# Patient Record
Sex: Female | Born: 1945 | Race: Black or African American | Hispanic: No | Marital: Single | Smoking: Never smoker
Health system: Southern US, Community
[De-identification: ages and names within clinical notes are randomized; demographics above are authoritative.]

## PROBLEM LIST (undated history)

## (undated) DIAGNOSIS — K219 Gastro-esophageal reflux disease without esophagitis: Secondary | ICD-10-CM

## (undated) DIAGNOSIS — E119 Type 2 diabetes mellitus without complications: Secondary | ICD-10-CM

## (undated) DIAGNOSIS — N189 Chronic kidney disease, unspecified: Secondary | ICD-10-CM

## (undated) DIAGNOSIS — I10 Essential (primary) hypertension: Secondary | ICD-10-CM

## (undated) DIAGNOSIS — N814 Uterovaginal prolapse, unspecified: Secondary | ICD-10-CM

## (undated) HISTORY — DX: Uterovaginal prolapse, unspecified: N81.4

---

## 2005-07-10 ENCOUNTER — Emergency Department: Payer: Self-pay | Admitting: Emergency Medicine

## 2005-07-10 ENCOUNTER — Other Ambulatory Visit: Payer: Self-pay

## 2005-09-07 ENCOUNTER — Emergency Department: Payer: Self-pay | Admitting: Emergency Medicine

## 2005-09-07 ENCOUNTER — Other Ambulatory Visit: Payer: Self-pay

## 2005-11-29 ENCOUNTER — Ambulatory Visit: Payer: Self-pay | Admitting: Ophthalmology

## 2006-02-17 ENCOUNTER — Other Ambulatory Visit: Payer: Self-pay

## 2006-02-17 ENCOUNTER — Emergency Department: Payer: Self-pay | Admitting: Emergency Medicine

## 2009-11-25 ENCOUNTER — Emergency Department: Payer: Self-pay | Admitting: Emergency Medicine

## 2010-03-13 ENCOUNTER — Emergency Department: Payer: Self-pay | Admitting: Emergency Medicine

## 2010-12-03 ENCOUNTER — Emergency Department: Payer: Self-pay | Admitting: Emergency Medicine

## 2011-05-21 ENCOUNTER — Emergency Department: Payer: Self-pay | Admitting: Emergency Medicine

## 2013-11-12 ENCOUNTER — Ambulatory Visit: Payer: Self-pay | Admitting: Ophthalmology

## 2013-11-12 LAB — POTASSIUM: Potassium: 3.1 mmol/L — ABNORMAL LOW (ref 3.5–5.1)

## 2013-11-25 ENCOUNTER — Ambulatory Visit: Payer: Self-pay | Admitting: Ophthalmology

## 2014-05-19 DIAGNOSIS — I10 Essential (primary) hypertension: Secondary | ICD-10-CM | POA: Diagnosis present

## 2014-05-19 DIAGNOSIS — E1122 Type 2 diabetes mellitus with diabetic chronic kidney disease: Secondary | ICD-10-CM | POA: Diagnosis present

## 2014-05-19 DIAGNOSIS — E669 Obesity, unspecified: Secondary | ICD-10-CM | POA: Diagnosis present

## 2014-12-27 NOTE — Op Note (Signed)
PATIENT NAME:  Tracy Webster, Tracy Webster MR#:  [id number] DATE OF BIRTH:  11/14/1945  DATE OF PROCEDURE:  11/25/2013  PREOPERATIVE DIAGNOSIS: Cataract, left eye.   POSTOPERATIVE DIAGNOSIS: Cataract, left eye.   PROCEDURE PERFORMED: Extracapsular cataract extraction using phacoemulsification with placement Alcon [id number] 22.0-diopter posterior chamber lens, serial number [id number].  SURGEON: Maylon PeppersSteven A. , M.D.   ANESTHESIA: 4% lidocaine and 0.75% Marcaine in a 50-50 mixture with 10 units/mL of Hylenex added, given as a peribulbar.   ANESTHESIOLOGIST: Dr. Darleene CleaverVan Staveren  COMPLICATIONS: None.   ESTIMATED BLOOD LOSS: Less than 1 mL.   DESCRIPTION OF PROCEDURE: The patient was brought to the operating room and given IV sedation and a peribulbar block. She was then prepped and draped in the usual fashion. Vertical rectus muscles were imbricated using 5-0 silk sutures, bridle sutures. Limbal peritomy was carried out. Hemostasis was obtained at the limbus using cautery and a partial-thickness scleral groove was made in the posterior surgical limbus. This was dissected anteriorly through clear cornea with an Alcon crescent knife. The anterior chamber was entered superonasally and superotemporally through clear cornea with a paracentesis knife. Air was injected through the paracentesis and VisionBlue was used to paint the anterior capsule. The air was replaced with balanced salt and the anterior chamber was entered through the lamellar dissection with a 2.6 mm keratome. DisCoVisc was used to replace the aqueous and a continuous tear circular capsulorrhexis was carried out. Hydrodelineation was used to loosen the nucleus and phacoemulsification was carried out in divide and conquer technique. Ultrasound time was 1 minute and 13.7 seconds with an average power of 21.8% and a CDE of 26.64. Irrigation-aspiration was used to remove the residual cortex. The capsular bag was inflated with DisCoVisc and an  intraocular lens was inserted in the capsular bag under direct visualization. Irrigation-aspiration was used to remove the residual DisCoVisc. The wound was inflated with balanced salt and Miostat was injected through the paracentesis track to induce miosis. The wound was checked for leaks. None were found. The conjunctiva was closed with cautery. The bridle sutures were removed. Two drops of Vigamox were placed on the surface of the eye and a shield was placed over the eye, and the patient was discharged to the recovery room in good condition.   ____________________________ Maylon PeppersSteven A. , MD sad:sb D: 11/25/2013 13:46:55 ET T: 11/25/2013 15:18:48 ET JOB#: [id number]  cc: Viviann SpareSteven A. , MD, <Dictator> Erline LevineSTEVEN A  MD ELECTRONICALLY SIGNED 12/02/2013 13:27

## 2015-09-17 ENCOUNTER — Other Ambulatory Visit: Payer: Self-pay | Admitting: Family Medicine

## 2015-09-17 DIAGNOSIS — Z1231 Encounter for screening mammogram for malignant neoplasm of breast: Secondary | ICD-10-CM

## 2015-10-14 ENCOUNTER — Emergency Department
Admission: EM | Admit: 2015-10-14 | Discharge: 2015-10-14 | Disposition: A | Discharge status: Home / Self Care | Payer: Medicare HMO | Attending: Emergency Medicine | Admitting: Emergency Medicine

## 2015-10-14 DIAGNOSIS — R1084 Generalized abdominal pain: Secondary | ICD-10-CM | POA: Diagnosis present

## 2015-10-14 DIAGNOSIS — K529 Noninfective gastroenteritis and colitis, unspecified: Secondary | ICD-10-CM | POA: Diagnosis not present

## 2015-10-14 HISTORY — DX: Type 2 diabetes mellitus without complications: E11.9

## 2015-10-14 HISTORY — DX: Essential (primary) hypertension: I10

## 2015-10-14 LAB — URINALYSIS COMPLETE WITH MICROSCOPIC (ARMC ONLY)
Bacteria, UA: NONE SEEN
Bilirubin Urine: NEGATIVE
Glucose, UA: NEGATIVE mg/dL
Ketones, ur: NEGATIVE mg/dL
Leukocytes, UA: NEGATIVE
Nitrite: NEGATIVE
Protein, ur: NEGATIVE mg/dL
Specific Gravity, Urine: 1.004 — ABNORMAL LOW (ref 1.005–1.030)
pH: 5 (ref 5.0–8.0)

## 2015-10-14 LAB — COMPREHENSIVE METABOLIC PANEL
ALBUMIN: 3.6 g/dL (ref 3.5–5.0)
ALK PHOS: 69 U/L (ref 38–126)
ALT: 25 U/L (ref 14–54)
ANION GAP: 9 (ref 5–15)
AST: 29 U/L (ref 15–41)
BILIRUBIN TOTAL: 0.6 mg/dL (ref 0.3–1.2)
BUN: 36 mg/dL — AB (ref 6–20)
CALCIUM: 7.8 mg/dL — AB (ref 8.9–10.3)
CO2: 22 mmol/L (ref 22–32)
CREATININE: 1.62 mg/dL — AB (ref 0.44–1.00)
Chloride: 103 mmol/L (ref 101–111)
GFR calc Af Amer: 36 mL/min — ABNORMAL LOW (ref >60)
GFR calc non Af Amer: 31 mL/min — ABNORMAL LOW (ref >60)
GLUCOSE: 134 mg/dL — AB (ref 65–99)
Potassium: 3.3 mmol/L — ABNORMAL LOW (ref 3.5–5.1)
Sodium: 134 mmol/L — ABNORMAL LOW (ref 135–145)
TOTAL PROTEIN: 7 g/dL (ref 6.5–8.1)

## 2015-10-14 LAB — CBC WITH DIFFERENTIAL/PLATELET
BASOS PCT: 0 %
Basophils Absolute: 0 10*3/uL (ref 0–0.1)
Eosinophils Absolute: 0.1 10*3/uL (ref 0–0.7)
Eosinophils Relative: 1 %
HEMATOCRIT: 40.9 % (ref 35.0–47.0)
HEMOGLOBIN: 13.7 g/dL (ref 12.0–16.0)
Lymphocytes Relative: 14 %
Lymphs Abs: 1 10*3/uL (ref 1.0–3.6)
MCH: 29.8 pg (ref 26.0–34.0)
MCHC: 33.4 g/dL (ref 32.0–36.0)
MCV: 89.2 fL (ref 80.0–100.0)
MONOS PCT: 7 %
Monocytes Absolute: 0.5 10*3/uL (ref 0.2–0.9)
NEUTROS ABS: 5.7 10*3/uL (ref 1.4–6.5)
NEUTROS PCT: 78 %
Platelets: 305 10*3/uL (ref 150–440)
RBC: 4.58 MIL/uL (ref 3.80–5.20)
RDW: 13.8 % (ref 11.5–14.5)
WBC: 7.4 10*3/uL (ref 3.6–11.0)

## 2015-10-14 LAB — LIPASE, BLOOD: Lipase: 20 U/L (ref 11–51)

## 2015-10-14 MED ORDER — LOPERAMIDE HCL 2 MG PO CAPS
4.0000 mg | ORAL_CAPSULE | Freq: Once | ORAL | Status: AC
  Administered 2015-10-14: 4 mg via ORAL
  Filled 2015-10-14: qty 2

## 2015-10-14 MED ORDER — FAMOTIDINE 20 MG PO TABS
20.0000 mg | ORAL_TABLET | Freq: Once | ORAL | Status: AC
  Administered 2015-10-14: 20 mg via ORAL
  Filled 2015-10-14: qty 1

## 2015-10-14 MED ORDER — SODIUM CHLORIDE 0.9 % IV SOLN
1000.0000 mL | Freq: Once | INTRAVENOUS | Status: AC
  Administered 2015-10-14: 1000 mL via INTRAVENOUS

## 2015-10-14 MED ORDER — ONDANSETRON HCL 4 MG/2ML IJ SOLN
4.0000 mg | Freq: Once | INTRAMUSCULAR | Status: AC
  Administered 2015-10-14: 4 mg via INTRAVENOUS
  Filled 2015-10-14: qty 2

## 2015-10-14 MED ORDER — ONDANSETRON HCL 4 MG PO TABS: 4.0000 mg | ORAL_TABLET | Freq: Every day | ORAL | Status: DC | PRN

## 2015-10-14 NOTE — ED Notes (Signed)
Pt discharged home, alert and oriented X 4 active and cooperative. Pt in NAD. Ambulatory.

## 2015-10-14 NOTE — ED Notes (Signed)
Patient complaining of N/V/D since yesterday at lunch time. Patient diabetic and didn't take meds this morning, concerned about blood sugar. CBG 122 per EMS. Patient in NAD at this time. Alert and oriented x4.

## 2015-10-14 NOTE — ED Notes (Signed)
AAOx3.  Skin warm and dry,. NAD 

## 2015-10-14 NOTE — ED Provider Notes (Signed)
[facility] Emergency Department Provider Note     Time seen: ----------------------------------------- 9:13 AM on 10/14/2015 -----------------------------------------    I have reviewed the triage vital signs and the nursing notes.   HISTORY  Chief Complaint Abdominal Pain    HPI Tracy Webster is a 70 y.o. female who presents to ER with nausea vomiting and diarrhea. She's not had any nausea vomiting this morning but has had several episodes of diarrhea. Yesterday the symptoms were much worse with more vomiting but she was able to eat something last night. Patient states blood sugar was in the 100s this morning, she is having generalized abdominal pain. Nothing makes it better or worse.   No past medical history on file.  There are no active problems to display for this patient.   No past surgical history on file.  Allergies Review of patient's allergies indicates not on file.  Social History Social History  Substance Use Topics  . Smoking status: Not on file  . Smokeless tobacco: Not on file  . Alcohol Use: Not on file    Review of Systems Constitutional: Negative for fever. Eyes: Negative for visual changes. ENT: Negative for sore throat. Cardiovascular: Negative for chest pain. Respiratory: Negative for shortness of breath. Gastrointestinal: Positive for abdominal pain, vomiting and diarrhea Genitourinary: Negative for dysuria. Musculoskeletal: Negative for back pain. Skin: Negative for rash. Neurological: Negative for headaches, focal weakness or numbness.  10-point ROS otherwise negative.  ____________________________________________   PHYSICAL EXAM:  VITAL SIGNS: ED Triage Vitals  Enc Vitals Group     BP --      Pulse --      Resp --      Temp --      Temp src --      SpO2 --      Weight --      Height --      Head Cir --      Peak Flow --      Pain Score --      Pain Loc --      Pain Edu? --      Excl. in  GC? --     Constitutional: Alert and oriented. Well appearing and in no distress. Eyes: Conjunctivae are normal. PERRL. Normal extraocular movements. ENT   Head: Normocephalic and atraumatic.   Nose: No congestion/rhinnorhea.   Mouth/Throat: Mucous membranes are moist.   Neck: No stridor. Cardiovascular: Normal rate, regular rhythm. Normal and symmetric distal pulses are present in all extremities. No murmurs, rubs, or gallops. Respiratory: Normal respiratory effort without tachypnea nor retractions. Breath sounds are clear and equal bilaterally. No wheezes/rales/rhonchi. Gastrointestinal: Soft and nontender. No distention. No abdominal bruits.  Musculoskeletal: Nontender with normal range of motion in all extremities. No joint effusions.  No lower extremity tenderness nor edema. Neurologic:  Normal speech and language. No gross focal neurologic deficits are appreciated.  Skin:  Skin is warm, dry and intact. No rash noted. Psychiatric: Mood and affect are normal. Speech and behavior are normal. Patient exhibits appropriate insight and judgment. ____________________________________________  ED COURSE:  Pertinent labs & imaging results that were available during my care of the patient were reviewed by me and considered in my medical decision making (see chart for details). Patient's no acute distress, likely Norovirus infection. She'll receive IV fluids and antiemetics. ____________________________________________    LABS (pertinent positives/negatives)  Labs Reviewed  COMPREHENSIVE METABOLIC PANEL - Abnormal; Notable for the following:    Sodium 134 (*)  Potassium 3.3 (*)    Glucose, Bld 134 (*)    BUN 36 (*)    Creatinine, Ser 1.62 (*)    Calcium 7.8 (*)    GFR calc non Af Amer 31 (*)    GFR calc Af Amer 36 (*)    All other components within normal limits  CBC WITH DIFFERENTIAL/PLATELET  LIPASE, BLOOD  URINALYSIS COMPLETEWITH MICROSCOPIC ([facility] ONLY)   ____________________________________________  FINAL ASSESSMENT AND PLAN  Gastroenteritis  Plan: Patient with labs as dictated above. Patient is feeling better, she is in no acute distress and will be discharged with antiemetics and encouraged to have follow-up with her doctor in 2 days for recheck.   Emily Filbert, MD   Emily Filbert, MD 10/14/15 1106

## 2015-10-14 NOTE — ED Notes (Signed)
Taking clear liquids well.  No N/V/D

## 2015-10-14 NOTE — Discharge Instructions (Signed)
Norovirus Infection °A norovirus infection is caused by exposure to a virus in a group of similar viruses (noroviruses). This type of infection causes inflammation in your stomach and intestines (gastroenteritis). Norovirus is the most common cause of gastroenteritis. It also causes food poisoning. °Anyone can get a norovirus infection. It spreads very easily (contagious). You can get it from contaminated food, water, surfaces, or other people. Norovirus is found in the stool or vomit of infected people. You can spread the infection as soon as you feel sick until 2 weeks after you recover.  °Symptoms usually begin within 2 days after you become infected. Most norovirus symptoms affect the digestive system. °CAUSES °Norovirus infection is caused by contact with norovirus. You can catch norovirus if you: °· Eat or drink something contaminated with norovirus. °· Touch surfaces or objects contaminated with norovirus and then put your hand in your mouth. °· Have direct contact with an infected person who has symptoms. °· Share food, drink, or utensils with someone with who is sick with norovirus. °SIGNS AND SYMPTOMS °Symptoms of norovirus may include: °· Nausea. °· Vomiting. °· Diarrhea. °· Stomach cramps. °· Fever. °· Chills. °· Headache. °· Muscle aches. °· Tiredness. °DIAGNOSIS °Your health care provider may suspect norovirus based on your symptoms and physical exam. Your health care provider may also test a sample of your stool or vomit for the virus.  °TREATMENT °There is no specific treatment for norovirus. Most people get better without treatment in about 2 days. °HOME CARE INSTRUCTIONS °· Replace lost fluids by drinking plenty of water or rehydration fluids containing important minerals called electrolytes. This prevents dehydration. Drink enough fluid to keep your urine clear or pale yellow. °· Do not prepare food for others while you are infected. Wait at least 3 days after recovering from the illness to do  that. °PREVENTION  °· Wash your hands often, especially after using the toilet or changing a diaper. °· Wash fruits and vegetables thoroughly before preparing or serving them. °· Throw out any food that a sick person may have touched. °· Disinfect contaminated surfaces immediately after someone in the household has been sick. Use a bleach-based household cleaner. °· Immediately remove and wash soiled clothes or sheets. °SEEK MEDICAL CARE IF: °· Your vomiting, diarrhea, and stomach pain is getting worse. °· Your symptoms of norovirus do not go away after 2-3 days. °SEEK IMMEDIATE MEDICAL CARE IF:  °You develop symptoms of dehydration that do not improve with fluid replacement. This may include: °· Excessive sleepiness. °· Lack of tears. °· Dry mouth. °· Dizziness when standing. °· Weak pulse. °  °This information is not intended to replace advice given to you by your health care provider. Make sure you discuss any questions you have with your health care provider. °  °Document Released: 11/12/2002 Document Revised: 09/12/2014 Document Reviewed: 01/30/2014 °Elsevier Interactive Patient Education ©2016 [employer] ° °

## 2016-02-15 ENCOUNTER — Emergency Department
Admission: EM | Admit: 2016-02-15 | Discharge: 2016-02-15 | Disposition: A | Discharge status: Home / Self Care | Payer: Medicare HMO | Attending: Emergency Medicine | Admitting: Emergency Medicine

## 2016-02-15 ENCOUNTER — Encounter: Payer: Self-pay | Admitting: Emergency Medicine

## 2016-02-15 DIAGNOSIS — Z79899 Other long term (current) drug therapy: Secondary | ICD-10-CM | POA: Diagnosis not present

## 2016-02-15 DIAGNOSIS — R0789 Other chest pain: Secondary | ICD-10-CM | POA: Insufficient documentation

## 2016-02-15 DIAGNOSIS — E119 Type 2 diabetes mellitus without complications: Secondary | ICD-10-CM | POA: Insufficient documentation

## 2016-02-15 DIAGNOSIS — R1013 Epigastric pain: Secondary | ICD-10-CM | POA: Diagnosis present

## 2016-02-15 DIAGNOSIS — K219 Gastro-esophageal reflux disease without esophagitis: Secondary | ICD-10-CM | POA: Insufficient documentation

## 2016-02-15 DIAGNOSIS — I10 Essential (primary) hypertension: Secondary | ICD-10-CM | POA: Diagnosis not present

## 2016-02-15 LAB — CBC WITH DIFFERENTIAL/PLATELET
Basophils Absolute: 0.1 10*3/uL (ref 0–0.1)
Basophils Relative: 1 %
EOS ABS: 0.1 10*3/uL (ref 0–0.7)
Eosinophils Relative: 2 %
HCT: 41.6 % (ref 35.0–47.0)
HEMOGLOBIN: 13.6 g/dL (ref 12.0–16.0)
LYMPHS ABS: 1.9 10*3/uL (ref 1.0–3.6)
Lymphocytes Relative: 36 %
MCH: 29.3 pg (ref 26.0–34.0)
MCHC: 32.8 g/dL (ref 32.0–36.0)
MCV: 89.4 fL (ref 80.0–100.0)
MONOS PCT: 8 %
Monocytes Absolute: 0.4 10*3/uL (ref 0.2–0.9)
NEUTROS PCT: 53 %
Neutro Abs: 2.8 10*3/uL (ref 1.4–6.5)
Platelets: 271 10*3/uL (ref 150–440)
RBC: 4.65 MIL/uL (ref 3.80–5.20)
RDW: 13.5 % (ref 11.5–14.5)
WBC: 5.2 10*3/uL (ref 3.6–11.0)

## 2016-02-15 LAB — COMPREHENSIVE METABOLIC PANEL
ALBUMIN: 4 g/dL (ref 3.5–5.0)
ALT: 17 U/L (ref 14–54)
AST: 22 U/L (ref 15–41)
Alkaline Phosphatase: 68 U/L (ref 38–126)
Anion gap: 9 (ref 5–15)
BUN: 27 mg/dL — AB (ref 6–20)
CHLORIDE: 108 mmol/L (ref 101–111)
CO2: 24 mmol/L (ref 22–32)
CREATININE: 1.52 mg/dL — AB (ref 0.44–1.00)
Calcium: 9 mg/dL (ref 8.9–10.3)
GFR calc non Af Amer: 34 mL/min — ABNORMAL LOW (ref >60)
GFR, EST AFRICAN AMERICAN: 39 mL/min — AB (ref >60)
Glucose, Bld: 121 mg/dL — ABNORMAL HIGH (ref 65–99)
Potassium: 3.7 mmol/L (ref 3.5–5.1)
SODIUM: 141 mmol/L (ref 135–145)
Total Bilirubin: 0.4 mg/dL (ref 0.3–1.2)
Total Protein: 7.1 g/dL (ref 6.5–8.1)

## 2016-02-15 LAB — LIPASE, BLOOD: LIPASE: 22 U/L (ref 11–51)

## 2016-02-15 LAB — TROPONIN I: Troponin I: <0.03 ng/mL (ref <0.031)

## 2016-02-15 MED ORDER — FAMOTIDINE 20 MG PO TABS
40.0000 mg | ORAL_TABLET | Freq: Once | ORAL | Status: AC
  Administered 2016-02-15: 40 mg via ORAL
  Filled 2016-02-15: qty 2

## 2016-02-15 MED ORDER — RANITIDINE HCL 150 MG PO CAPS: 150.0000 mg | ORAL_CAPSULE | Freq: Two times a day (BID) | ORAL | Status: DC

## 2016-02-15 MED ORDER — SUCRALFATE 1 G PO TABS: 1.0000 g | ORAL_TABLET | Freq: Four times a day (QID) | ORAL | Status: DC

## 2016-02-15 MED ORDER — GI COCKTAIL ~~LOC~~
30.0000 mL | ORAL | Status: AC
  Administered 2016-02-15: 30 mL via ORAL
  Filled 2016-02-15: qty 30

## 2016-02-15 NOTE — ED Provider Notes (Signed)
[facility] Emergency Department Provider Note  ____________________________________________  Time seen: 10:00 AM  I have reviewed the triage vital signs and the nursing notes.   HISTORY  Chief Complaint Gastroesophageal Reflux    HPI Tracy Webster is a 70 y.o. female who complains of epigastric pain radiating up into the middle of her chest. It is otherwise nonradiating. Not associated with short of breath diaphoresis vomiting. It is been constant for the past 2 or 3 days and feels exactly like her GERD. She is convinced that it is GERD. It gets worse after she eats. It is not exertional, not pleuritic. No other aggravating or alleviating factors.     Past Medical History  Diagnosis Date  . Diabetes mellitus without complication (HCC)   . Hypertension      There are no active problems to display for this patient.    History reviewed. No pertinent past surgical history.   Current Outpatient Rx  Name  Route  Sig  Dispense  Refill  . acetaminophen (TYLENOL) 500 MG tablet   Oral   Take 1,000 mg by mouth every 6 (six) hours as needed for mild pain, moderate pain or fever.          . diltiazem (CARDIZEM CD) 300 MG 24 hr capsule   Oral   Take 300 mg by mouth daily.         Marland Kitchen. docusate sodium (COLACE) 100 MG capsule   Oral   Take 100 mg by mouth 2 (two) times daily.         . fexofenadine (ALLEGRA) 180 MG tablet   Oral   Take 180 mg by mouth daily as needed for allergies.          . furosemide (LASIX) 20 MG tablet   Oral   Take 20 mg by mouth every morning.         Marland Kitchen. lisinopril (PRINIVIL,ZESTRIL) 40 MG tablet   Oral   Take 1 tablet by mouth daily.         Marland Kitchen. lovastatin (MEVACOR) 20 MG tablet   Oral   Take 1 tablet by mouth every evening.          . Multiple Vitamins-Minerals (CENTRUM SILVER) tablet   Oral   Take 1 tablet by mouth daily.         . ondansetron (ZOFRAN) 4 MG tablet   Oral   Take 1 tablet (4 mg  total) by mouth daily as needed for nausea or vomiting.   20 tablet   1   . ranitidine (ZANTAC) 150 MG capsule   Oral   Take 1 capsule (150 mg total) by mouth 2 (two) times daily.   28 capsule   0   . sucralfate (CARAFATE) 1 g tablet   Oral   Take 1 tablet (1 g total) by mouth 4 (four) times daily.   120 tablet   1      Allergies Tetracyclines & related   No family history on file.  Social History Social History  Substance Use Topics  . Smoking status: Never Smoker   . Smokeless tobacco: Never Used  . Alcohol Use: No    Review of Systems  Constitutional:   No fever or chills.  Eyes:   No vision changes.  ENT:   No sore throat. No rhinorrhea. Cardiovascular:   Positive as above chest pain. Respiratory:   No dyspnea or cough. Gastrointestinal:   Negative for abdominal pain, vomiting and diarrhea.  Genitourinary:   Negative for dysuria or difficulty urinating. Musculoskeletal:   Negative for focal pain or swelling Neurological:   Negative for headaches 10-point ROS otherwise negative.  ____________________________________________   PHYSICAL EXAM:  VITAL SIGNS: ED Triage Vitals  Enc Vitals Group     BP 02/15/16 0817 173/67 mmHg     Pulse Rate 02/15/16 0817 78     Resp 02/15/16 0817 16     Temp 02/15/16 0817 98 F (36.7 C)     Temp Source 02/15/16 0817 Oral     SpO2 02/15/16 0817 95 %     Weight 02/15/16 0817 180 lb (81.647 kg)     Height 02/15/16 0817 5' (1.524 m)     Head Cir --      Peak Flow --      Pain Score --      Pain Loc --      Pain Edu? --      Excl. in GC? --     Vital signs reviewed, nursing assessments reviewed.   Constitutional:   Alert and oriented. Well appearing and in no distress.Happy and joking. Smiling. Eyes:   No scleral icterus. No conjunctival pallor. PERRL. EOMI.  No nystagmus. ENT   Head:   Normocephalic and atraumatic.   Nose:   No congestion/rhinnorhea. No septal hematoma   Mouth/Throat:   MMM, no  pharyngeal erythema. No peritonsillar mass.    Neck:   No stridor. No SubQ emphysema. No meningismus. No JVD Hematological/Lymphatic/Immunilogical:   No cervical lymphadenopathy. Cardiovascular:   RRR. Symmetric bilateral radial and DP pulses.  No murmurs.  Respiratory:   Normal respiratory effort without tachypnea nor retractions. Breath sounds are clear and equal bilaterally. No wheezes/rales/rhonchi. Gastrointestinal:   Soft and nontender. Non distended. There is no CVA tenderness.  No rebound, rigidity, or guarding. Genitourinary:   deferred Musculoskeletal:   Nontender with normal range of motion in all extremities. No joint effusions.  No lower extremity tenderness.  No edema. Neurologic:   Normal speech and language.  CN 2-10 normal. Motor grossly intact. No gross focal neurologic deficits are appreciated.  Skin:    Skin is warm, dry and intact. No rash noted.  No petechiae, purpura, or bullae.  ____________________________________________    LABS (pertinent positives/negatives) (all labs ordered are listed, but only abnormal results are displayed) Labs Reviewed  COMPREHENSIVE METABOLIC PANEL - Abnormal; Notable for the following:    Glucose, Bld 121 (*)    BUN 27 (*)    Creatinine, Ser 1.52 (*)    GFR calc non Af Amer 34 (*)    GFR calc Af Amer 39 (*)    All other components within normal limits  CBC WITH DIFFERENTIAL/PLATELET  LIPASE, BLOOD  TROPONIN I  CBC WITH DIFFERENTIAL/PLATELET   ____________________________________________   EKG  Interpreted by me Normal sinus rhythm rate of 73, normal axis and intervals. Poor R-wave progression in anterior precordial leads. Normal ST segments and T waves.  ____________________________________________    RADIOLOGY    ____________________________________________   PROCEDURES   ____________________________________________   INITIAL IMPRESSION / ASSESSMENT AND PLAN / ED COURSE  Pertinent labs & imaging  results that were available during my care of the patient were reviewed by me and considered in my medical decision making (see chart for details).  Patient presents with a typical constant pain for the past 2-3 date days, strongly convinced that it is exactly like her usual GERD. Low suspicion for PE ACS or dissection. No evidence of  pneumothorax. Low suspicion for pneumonia or sepsis. Patient was given a GI cocktail and her symptoms resolved. Labs are stable, troponin is negative which should be reliable given the constant symptoms that of an unchanging for the past 2 days at least. We'll discharge him to follow up with primary care and cardiology, continue antacid therapy.     ____________________________________________   FINAL CLINICAL IMPRESSION(S) / ED DIAGNOSES  Final diagnoses:  Gastroesophageal reflux disease, esophagitis presence not specified  Atypical chest pain       Portions of this note were generated with dragon dictation software. Dictation errors may occur despite best attempts at proofreading.   Sharman Cheek, MD 02/15/16 1224

## 2016-02-15 NOTE — Discharge Instructions (Signed)
Gastroesophageal Reflux Disease, Adult °Normally, food travels down the esophagus and stays in the stomach to be digested. However, when a person has gastroesophageal reflux disease (GERD), food and stomach acid move back up into the esophagus. When this happens, the esophagus becomes sore and inflamed. Over time, GERD can create small holes (ulcers) in the lining of the esophagus.  °CAUSES °This condition is caused by a problem with the muscle between the esophagus and the stomach (lower esophageal sphincter, or LES). Normally, the LES muscle closes after food passes through the esophagus to the stomach. When the LES is weakened or abnormal, it does not close properly, and that allows food and stomach acid to go back up into the esophagus. The LES can be weakened by certain dietary substances, medicines, and medical conditions, including: °· Tobacco use. °· Pregnancy. °· Having a hiatal hernia. °· Heavy alcohol use. °· Certain foods and beverages, such as coffee, chocolate, onions, and peppermint. °RISK FACTORS °This condition is more likely to develop in: °· People who have an increased body weight. °· People who have connective tissue disorders. °· People who use NSAID medicines. °SYMPTOMS °Symptoms of this condition include: °· Heartburn. °· Difficult or painful swallowing. °· The feeling of having a lump in the throat. °· A bitter taste in the mouth. °· Bad breath. °· Having a large amount of saliva. °· Having an upset or bloated stomach. °· Belching. °· Chest pain. °· Shortness of breath or wheezing. °· Ongoing (chronic) cough or a night-time cough. °· Wearing away of tooth enamel. °· Weight loss. °Different conditions can cause chest pain. Make sure to see your health care provider if you experience chest pain. °DIAGNOSIS °Your health care provider will take a medical history and perform a physical exam. To determine if you have mild or severe GERD, your health care provider may also monitor how you respond  to treatment. You may also have other tests, including: °· An endoscopy to examine your stomach and esophagus with a small camera. °· A test that measures the acidity level in your esophagus. °· A test that measures how much pressure is on your esophagus. °· A barium swallow or modified barium swallow to show the shape, size, and functioning of your esophagus. °TREATMENT °The goal of treatment is to help relieve your symptoms and to prevent complications. Treatment for this condition may vary depending on how severe your symptoms are. Your health care provider may recommend: °· Changes to your diet. °· Medicine. °· Surgery. °HOME CARE INSTRUCTIONS °Diet °· Follow a diet as recommended by your health care provider. This may involve avoiding foods and drinks such as: °¨ Coffee and tea (with or without caffeine). °¨ Drinks that contain alcohol. °¨ Energy drinks and sports drinks. °¨ Carbonated drinks or sodas. °¨ Chocolate and cocoa. °¨ Peppermint and mint flavorings. °¨ Garlic and onions. °¨ Horseradish. °¨ Spicy and acidic foods, including peppers, chili powder, curry powder, vinegar, hot sauces, and barbecue sauce. °¨ Citrus fruit juices and citrus fruits, such as oranges, lemons, and limes. °¨ Tomato-based foods, such as red sauce, chili, salsa, and pizza with red sauce. °¨ Fried and fatty foods, such as donuts, french fries, potato chips, and high-fat dressings. °¨ High-fat meats, such as hot dogs and fatty cuts of red and white meats, such as rib eye steak, sausage, ham, and bacon. °¨ High-fat dairy items, such as whole milk, butter, and cream cheese. °· Eat small, frequent meals instead of large meals. °· Avoid drinking large amounts of liquid with your   meals. °· Avoid eating meals during the 2-3 hours before bedtime. °· Avoid lying down right after you eat. °· Do not exercise right after you eat. ° General Instructions  °· Pay attention to any changes in your symptoms. °· Take over-the-counter and prescription  medicines only as told by your health care provider. Do not take aspirin, ibuprofen, or other NSAIDs unless your health care provider told you to do so. °· Do not use any tobacco products, including cigarettes, chewing tobacco, and e-cigarettes. If you need help quitting, ask your health care provider. °· Wear loose-fitting clothing. Do not wear anything tight around your waist that causes pressure on your abdomen. °· Raise (elevate) the head of your bed 6 inches (15cm). °· Try to reduce your stress, such as with yoga or meditation. If you need help reducing stress, ask your health care provider. °· If you are overweight, reduce your weight to an amount that is healthy for you. Ask your health care provider for guidance about a safe weight loss goal. °· Keep all follow-up visits as told by your health care provider. This is important. °SEEK MEDICAL CARE IF: °· You have new symptoms. °· You have unexplained weight loss. °· You have difficulty swallowing, or it hurts to swallow. °· You have wheezing or a persistent cough. °· Your symptoms do not improve with treatment. °· You have a hoarse voice. °SEEK IMMEDIATE MEDICAL CARE IF: °· You have pain in your arms, neck, jaw, teeth, or back. °· You feel sweaty, dizzy, or light-headed. °· You have chest pain or shortness of breath. °· You vomit and your vomit looks like blood or coffee grounds. °· You faint. °· Your stool is bloody or black. °· You cannot swallow, drink, or eat. °  °This information is not intended to replace advice given to you by your health care provider. Make sure you discuss any questions you have with your health care provider. °  °Document Released: 06/01/2005 Document Revised: 05/13/2015 Document Reviewed: 12/17/2014 °Elsevier Interactive Patient Education ©2016 [employer] °Nonspecific Chest Pain  °Chest pain can be caused by many different conditions. There is always a chance that your pain could be related to something serious, such as a heart  attack or a blood clot in your lungs. Chest pain can also be caused by conditions that are not life-threatening. If you have chest pain, it is very important to follow up with your health care provider. °CAUSES  °Chest pain can be caused by: °· Heartburn. °· Pneumonia or bronchitis. °· Anxiety or stress. °· Inflammation around your heart (pericarditis) or lung (pleuritis or pleurisy). °· A blood clot in your lung. °· A collapsed lung (pneumothorax). It can develop suddenly on its own (spontaneous pneumothorax) or from trauma to the chest. °· Shingles infection (varicella-zoster virus). °· Heart attack. °· Damage to the bones, muscles, and cartilage that make up your chest wall. This can include: °· Bruised bones due to injury. °· Strained muscles or cartilage due to frequent or repeated coughing or overwork. °· Fracture to one or more ribs. °· Sore cartilage due to inflammation (costochondritis). °RISK FACTORS  °Risk factors for chest pain may include: °· Activities that increase your risk for trauma or injury to your chest. °· Respiratory infections or conditions that cause frequent coughing. °· Medical conditions or overeating that can cause heartburn. °· Heart disease or family history of heart disease. °· Conditions or health behaviors that increase your risk of developing a blood clot. °· Having had chicken pox (  varicella zoster). °SIGNS AND SYMPTOMS °Chest pain can feel like: °· Burning or tingling on the surface of your chest or deep in your chest. °· Crushing, pressure, aching, or squeezing pain. °· Dull or sharp pain that is worse when you move, cough, or take a deep breath. °· Pain that is also felt in your back, neck, shoulder, or arm, or pain that spreads to any of these areas. °Your chest pain may come and go, or it may stay constant. °DIAGNOSIS °Lab tests or other studies may be needed to find the cause of your pain. Your health care provider may have you take a test called an ambulatory ECG  (electrocardiogram). An ECG records your heartbeat patterns at the time the test is performed. You may also have other tests, such as: °· Transthoracic echocardiogram (TTE). During echocardiography, sound waves are used to create a picture of all of the heart structures and to look at how blood flows through your heart. °· Transesophageal echocardiogram (TEE). This is a more advanced imaging test that obtains images from inside your body. It allows your health care provider to see your heart in finer detail. °· Cardiac monitoring. This allows your health care provider to monitor your heart rate and rhythm in real time. °· Holter monitor. This is a portable device that records your heartbeat and can help to diagnose abnormal heartbeats. It allows your health care provider to track your heart activity for several days, if needed. °· Stress tests. These can be done through exercise or by taking medicine that makes your heart beat more quickly. °· Blood tests. °· Imaging tests. °TREATMENT  °Your treatment depends on what is causing your chest pain. Treatment may include: °· Medicines. These may include: °· Acid blockers for heartburn. °· Anti-inflammatory medicine. °· Pain medicine for inflammatory conditions. °· Antibiotic medicine, if an infection is present. °· Medicines to dissolve blood clots. °· Medicines to treat coronary artery disease. °· Supportive care for conditions that do not require medicines. This may include: °· Resting. °· Applying heat or cold packs to injured areas. °· Limiting activities until pain decreases. °HOME CARE INSTRUCTIONS °· If you were prescribed an antibiotic medicine, finish it all even if you start to feel better. °· Avoid any activities that bring on chest pain. °· Do not use any tobacco products, including cigarettes, chewing tobacco, or electronic cigarettes. If you need help quitting, ask your health care provider. °· Do not drink alcohol. °· Take medicines only as directed by  your health care provider. °· Keep all follow-up visits as directed by your health care provider. This is important. This includes any further testing if your chest pain does not go away. °· If heartburn is the cause for your chest pain, you may be told to keep your head raised (elevated) while sleeping. This reduces the chance that acid will go from your stomach into your esophagus. °· Make lifestyle changes as directed by your health care provider. These may include: °¨ Getting regular exercise. Ask your health care provider to suggest some activities that are safe for you. °¨ Eating a heart-healthy diet. A registered dietitian can help you to learn healthy eating options. °¨ Maintaining a healthy weight. °¨ Managing diabetes, if necessary. °¨ Reducing stress. °SEEK MEDICAL CARE IF: °· Your chest pain does not go away after treatment. °· You have a rash with blisters on your chest. °· You have a fever. °SEEK IMMEDIATE MEDICAL CARE IF:  °· Your chest pain is worse. °· You   have an increasing cough, or you cough up blood. °· You have severe abdominal pain. °· You have severe weakness. °· You faint. °· You have chills. °· You have sudden, unexplained chest discomfort. °· You have sudden, unexplained discomfort in your arms, back, neck, or jaw. °· You have shortness of breath at any time. °· You suddenly start to sweat, or your skin gets clammy. °· You feel nauseous or you vomit. °· You suddenly feel light-headed or dizzy. °· Your heart begins to beat quickly, or it feels like it is skipping beats. °These symptoms may represent a serious problem that is an emergency. Do not wait to see if the symptoms will go away. Get medical help right away. Call your local emergency services (911 in the [place] Do not drive yourself to the hospital. °  °This information is not intended to replace advice given to you by your health care provider. Make sure you discuss any questions you have with your health care provider. °  °Document  Released: 06/01/2005 Document Revised: 09/12/2014 Document Reviewed: 03/28/2014 °Elsevier Interactive Patient Education ©2016 [employer] ° °

## 2016-02-15 NOTE — ED Notes (Signed)
States she is having epigastric pain /"bubbling" to chest over the past week  Not sure if it is her reflux or her heart

## 2016-02-15 NOTE — ED Notes (Signed)
States GI cocktail relieved her symptoms.

## 2016-02-15 NOTE — ED Notes (Addendum)
Pt presents with epigastric pain for one week and wants to be checked for acid reflux.  Pt denies any chest pain at this time, NAD. Pt has not had any of her meds this am.

## 2016-12-13 ENCOUNTER — Other Ambulatory Visit: Payer: Self-pay | Admitting: Family Medicine

## 2016-12-13 DIAGNOSIS — Z1239 Encounter for other screening for malignant neoplasm of breast: Secondary | ICD-10-CM

## 2016-12-16 ENCOUNTER — Other Ambulatory Visit: Payer: Self-pay | Admitting: Family Medicine

## 2016-12-16 DIAGNOSIS — Z13820 Encounter for screening for osteoporosis: Secondary | ICD-10-CM

## 2017-08-14 ENCOUNTER — Emergency Department (HOSPITAL_COMMUNITY): Payer: Medicare HMO

## 2017-08-14 ENCOUNTER — Encounter (HOSPITAL_COMMUNITY): Payer: Self-pay | Admitting: Emergency Medicine

## 2017-08-14 ENCOUNTER — Inpatient Hospital Stay (HOSPITAL_COMMUNITY)
Admission: EM | Admit: 2017-08-14 | Discharge: 2017-08-17 | DRG: 291 | Disposition: A | Discharge status: Home / Self Care | Payer: Medicare HMO | Attending: Family Medicine | Admitting: Family Medicine

## 2017-08-14 ENCOUNTER — Other Ambulatory Visit: Payer: Self-pay

## 2017-08-14 DIAGNOSIS — R0902 Hypoxemia: Secondary | ICD-10-CM

## 2017-08-14 DIAGNOSIS — I5031 Acute diastolic (congestive) heart failure: Secondary | ICD-10-CM | POA: Diagnosis present

## 2017-08-14 DIAGNOSIS — G4733 Obstructive sleep apnea (adult) (pediatric): Secondary | ICD-10-CM | POA: Diagnosis present

## 2017-08-14 DIAGNOSIS — E785 Hyperlipidemia, unspecified: Secondary | ICD-10-CM | POA: Diagnosis present

## 2017-08-14 DIAGNOSIS — K219 Gastro-esophageal reflux disease without esophagitis: Secondary | ICD-10-CM | POA: Diagnosis present

## 2017-08-14 DIAGNOSIS — J9801 Acute bronchospasm: Secondary | ICD-10-CM

## 2017-08-14 DIAGNOSIS — R0602 Shortness of breath: Secondary | ICD-10-CM | POA: Diagnosis present

## 2017-08-14 DIAGNOSIS — I130 Hypertensive heart and chronic kidney disease with heart failure and stage 1 through stage 4 chronic kidney disease, or unspecified chronic kidney disease: Principal | ICD-10-CM | POA: Diagnosis present

## 2017-08-14 DIAGNOSIS — T383X5A Adverse effect of insulin and oral hypoglycemic [antidiabetic] drugs, initial encounter: Secondary | ICD-10-CM | POA: Diagnosis present

## 2017-08-14 DIAGNOSIS — J441 Chronic obstructive pulmonary disease with (acute) exacerbation: Secondary | ICD-10-CM | POA: Diagnosis not present

## 2017-08-14 DIAGNOSIS — E160 Drug-induced hypoglycemia without coma: Secondary | ICD-10-CM

## 2017-08-14 DIAGNOSIS — E669 Obesity, unspecified: Secondary | ICD-10-CM | POA: Diagnosis present

## 2017-08-14 DIAGNOSIS — D631 Anemia in chronic kidney disease: Secondary | ICD-10-CM | POA: Diagnosis present

## 2017-08-14 DIAGNOSIS — Z794 Long term (current) use of insulin: Secondary | ICD-10-CM

## 2017-08-14 DIAGNOSIS — E1122 Type 2 diabetes mellitus with diabetic chronic kidney disease: Secondary | ICD-10-CM | POA: Diagnosis present

## 2017-08-14 DIAGNOSIS — E119 Type 2 diabetes mellitus without complications: Secondary | ICD-10-CM

## 2017-08-14 DIAGNOSIS — Z6835 Body mass index (BMI) 35.0-35.9, adult: Secondary | ICD-10-CM

## 2017-08-14 DIAGNOSIS — Z9981 Dependence on supplemental oxygen: Secondary | ICD-10-CM | POA: Diagnosis not present

## 2017-08-14 DIAGNOSIS — I509 Heart failure, unspecified: Secondary | ICD-10-CM

## 2017-08-14 DIAGNOSIS — N184 Chronic kidney disease, stage 4 (severe): Secondary | ICD-10-CM | POA: Diagnosis not present

## 2017-08-14 DIAGNOSIS — J9600 Acute respiratory failure, unspecified whether with hypoxia or hypercapnia: Secondary | ICD-10-CM | POA: Diagnosis not present

## 2017-08-14 DIAGNOSIS — R0600 Dyspnea, unspecified: Secondary | ICD-10-CM | POA: Diagnosis not present

## 2017-08-14 DIAGNOSIS — E161 Other hypoglycemia: Secondary | ICD-10-CM | POA: Diagnosis not present

## 2017-08-14 DIAGNOSIS — N179 Acute kidney failure, unspecified: Secondary | ICD-10-CM | POA: Diagnosis present

## 2017-08-14 DIAGNOSIS — J9601 Acute respiratory failure with hypoxia: Secondary | ICD-10-CM | POA: Diagnosis present

## 2017-08-14 DIAGNOSIS — J449 Chronic obstructive pulmonary disease, unspecified: Secondary | ICD-10-CM | POA: Diagnosis present

## 2017-08-14 DIAGNOSIS — E11649 Type 2 diabetes mellitus with hypoglycemia without coma: Secondary | ICD-10-CM | POA: Diagnosis present

## 2017-08-14 DIAGNOSIS — N183 Chronic kidney disease, stage 3 unspecified: Secondary | ICD-10-CM

## 2017-08-14 DIAGNOSIS — J9621 Acute and chronic respiratory failure with hypoxia: Secondary | ICD-10-CM | POA: Insufficient documentation

## 2017-08-14 HISTORY — DX: Chronic kidney disease, unspecified: N18.9

## 2017-08-14 HISTORY — DX: Gastro-esophageal reflux disease without esophagitis: K21.9

## 2017-08-14 LAB — CBC WITH DIFFERENTIAL/PLATELET
BASOS PCT: 1 %
Basophils Absolute: 0 10*3/uL (ref 0.0–0.1)
EOS ABS: 0 10*3/uL (ref 0.0–0.7)
Eosinophils Relative: 1 %
HEMATOCRIT: 38.1 % (ref 36.0–46.0)
HEMOGLOBIN: 11.6 g/dL — AB (ref 12.0–15.0)
LYMPHS ABS: 1 10*3/uL (ref 0.7–4.0)
Lymphocytes Relative: 16 %
MCH: 29.3 pg (ref 26.0–34.0)
MCHC: 30.4 g/dL (ref 30.0–36.0)
MCV: 96.2 fL (ref 78.0–100.0)
MONO ABS: 0.8 10*3/uL (ref 0.1–1.0)
MONOS PCT: 14 %
NEUTROS ABS: 4 10*3/uL (ref 1.7–7.7)
NEUTROS PCT: 68 %
Platelets: 230 10*3/uL (ref 150–400)
RBC: 3.96 MIL/uL (ref 3.87–5.11)
RDW: 14 % (ref 11.5–15.5)
WBC: 5.9 10*3/uL (ref 4.0–10.5)

## 2017-08-14 LAB — BASIC METABOLIC PANEL
Anion gap: 8 (ref 5–15)
BUN: 40 mg/dL — AB (ref 6–20)
CALCIUM: 8.4 mg/dL — AB (ref 8.9–10.3)
CHLORIDE: 105 mmol/L (ref 101–111)
CO2: 30 mmol/L (ref 22–32)
CREATININE: 2.14 mg/dL — AB (ref 0.44–1.00)
GFR calc Af Amer: 26 mL/min — ABNORMAL LOW (ref >60)
GFR calc non Af Amer: 22 mL/min — ABNORMAL LOW (ref >60)
Glucose, Bld: 34 mg/dL — CL (ref 65–99)
Potassium: 3.6 mmol/L (ref 3.5–5.1)
Sodium: 143 mmol/L (ref 135–145)

## 2017-08-14 LAB — CBG MONITORING, ED
GLUCOSE-CAPILLARY: 170 mg/dL — AB (ref 65–99)
Glucose-Capillary: 164 mg/dL — ABNORMAL HIGH (ref 65–99)
Glucose-Capillary: 29 mg/dL — CL (ref 65–99)
Glucose-Capillary: 141 mg/dL — ABNORMAL HIGH (ref 65–99)

## 2017-08-14 LAB — GLUCOSE, CAPILLARY: GLUCOSE-CAPILLARY: 158 mg/dL — AB (ref 65–99)

## 2017-08-14 LAB — BRAIN NATRIURETIC PEPTIDE: B Natriuretic Peptide: 66 pg/mL (ref 0.0–100.0)

## 2017-08-14 LAB — TROPONIN I: Troponin I: <0.03 ng/mL (ref <0.03)

## 2017-08-14 MED ORDER — HEPARIN SODIUM (PORCINE) 5000 UNIT/ML IJ SOLN
5000.0000 [IU] | Freq: Three times a day (TID) | INTRAMUSCULAR | Status: DC
  Administered 2017-08-14 – 2017-08-17 (×8): 5000 [IU] via SUBCUTANEOUS
  Filled 2017-08-14 (×8): qty 1

## 2017-08-14 MED ORDER — ALBUTEROL (5 MG/ML) CONTINUOUS INHALATION SOLN
10.0000 mg/h | INHALATION_SOLUTION | Freq: Once | RESPIRATORY_TRACT | Status: AC
  Administered 2017-08-14: 10 mg/h via RESPIRATORY_TRACT
  Filled 2017-08-14: qty 20

## 2017-08-14 MED ORDER — ACETAMINOPHEN 325 MG PO TABS: 650.0000 mg | ORAL_TABLET | ORAL | Status: DC | PRN

## 2017-08-14 MED ORDER — ADULT MULTIVITAMIN W/MINERALS CH
1.0000 | ORAL_TABLET | Freq: Every day | ORAL | Status: DC
  Administered 2017-08-15 – 2017-08-17 (×3): 1 via ORAL
  Filled 2017-08-14 (×3): qty 1

## 2017-08-14 MED ORDER — PRAVASTATIN SODIUM 10 MG PO TABS
20.0000 mg | ORAL_TABLET | Freq: Every day | ORAL | Status: DC
  Administered 2017-08-14 – 2017-08-16 (×3): 20 mg via ORAL
  Filled 2017-08-14: qty 2
  Filled 2017-08-14: qty 1
  Filled 2017-08-14: qty 2
  Filled 2017-08-14: qty 1
  Filled 2017-08-14: qty 2

## 2017-08-14 MED ORDER — CENTRUM SILVER PO TABS: 1.0000 | ORAL_TABLET | Freq: Every day | ORAL | Status: DC

## 2017-08-14 MED ORDER — FAMOTIDINE 20 MG PO TABS
20.0000 mg | ORAL_TABLET | Freq: Two times a day (BID) | ORAL | Status: DC
  Administered 2017-08-14: 20 mg via ORAL
  Filled 2017-08-14: qty 1

## 2017-08-14 MED ORDER — LISINOPRIL 10 MG PO TABS: 40.0000 mg | ORAL_TABLET | Freq: Every day | ORAL | Status: DC

## 2017-08-14 MED ORDER — POTASSIUM CHLORIDE CRYS ER 20 MEQ PO TBCR
10.0000 meq | EXTENDED_RELEASE_TABLET | Freq: Two times a day (BID) | ORAL | Status: DC
  Administered 2017-08-14 – 2017-08-17 (×6): 10 meq via ORAL
  Filled 2017-08-14 (×6): qty 1

## 2017-08-14 MED ORDER — ISOSORBIDE MONONITRATE ER 60 MG PO TB24
60.0000 mg | ORAL_TABLET | Freq: Every morning | ORAL | Status: DC
  Administered 2017-08-15 – 2017-08-17 (×3): 60 mg via ORAL
  Filled 2017-08-14 (×3): qty 1

## 2017-08-14 MED ORDER — FUROSEMIDE 10 MG/ML IJ SOLN
20.0000 mg | Freq: Once | INTRAMUSCULAR | Status: AC
  Administered 2017-08-14: 20 mg via INTRAVENOUS
  Filled 2017-08-14: qty 2

## 2017-08-14 MED ORDER — SODIUM CHLORIDE 0.9% FLUSH
3.0000 mL | Freq: Two times a day (BID) | INTRAVENOUS | Status: DC
  Administered 2017-08-14 – 2017-08-17 (×6): 3 mL via INTRAVENOUS

## 2017-08-14 MED ORDER — ONDANSETRON HCL 4 MG/2ML IJ SOLN: 4.0000 mg | Freq: Four times a day (QID) | INTRAMUSCULAR | Status: DC | PRN

## 2017-08-14 MED ORDER — DOCUSATE SODIUM 100 MG PO CAPS
100.0000 mg | ORAL_CAPSULE | Freq: Two times a day (BID) | ORAL | Status: DC
  Administered 2017-08-14 – 2017-08-17 (×6): 100 mg via ORAL
  Filled 2017-08-14 (×6): qty 1

## 2017-08-14 MED ORDER — INSULIN ASPART 100 UNIT/ML ~~LOC~~ SOLN
0.0000 [IU] | Freq: Three times a day (TID) | SUBCUTANEOUS | Status: DC
  Administered 2017-08-15 – 2017-08-16 (×3): 3 [IU] via SUBCUTANEOUS
  Administered 2017-08-16 (×2): 7 [IU] via SUBCUTANEOUS
  Administered 2017-08-17: 3 [IU] via SUBCUTANEOUS
  Administered 2017-08-17: 7 [IU] via SUBCUTANEOUS

## 2017-08-14 MED ORDER — DILTIAZEM HCL ER COATED BEADS 300 MG PO CP24
300.0000 mg | ORAL_CAPSULE | Freq: Every day | ORAL | Status: DC
  Administered 2017-08-15 – 2017-08-17 (×3): 300 mg via ORAL
  Filled 2017-08-14 (×6): qty 1

## 2017-08-14 MED ORDER — DEXTROSE 50 % IV SOLN
INTRAVENOUS | Status: AC
  Filled 2017-08-14: qty 50

## 2017-08-14 MED ORDER — IPRATROPIUM BROMIDE 0.02 % IN SOLN
1.0000 mg | Freq: Once | RESPIRATORY_TRACT | Status: AC
  Administered 2017-08-14: 1 mg via RESPIRATORY_TRACT
  Filled 2017-08-14: qty 5

## 2017-08-14 MED ORDER — INSULIN ASPART 100 UNIT/ML ~~LOC~~ SOLN
0.0000 [IU] | Freq: Every day | SUBCUTANEOUS | Status: DC
  Administered 2017-08-15: 4 [IU] via SUBCUTANEOUS
  Administered 2017-08-16: 5 [IU] via SUBCUTANEOUS

## 2017-08-14 MED ORDER — SODIUM CHLORIDE 0.9% FLUSH: 3.0000 mL | INTRAVENOUS | Status: DC | PRN

## 2017-08-14 MED ORDER — HYDRALAZINE HCL 20 MG/ML IJ SOLN: 10.0000 mg | INTRAMUSCULAR | Status: DC | PRN

## 2017-08-14 MED ORDER — SODIUM CHLORIDE 0.9 % IV SOLN: 250.0000 mL | INTRAVENOUS | Status: DC | PRN

## 2017-08-14 MED ORDER — ALBUTEROL SULFATE (2.5 MG/3ML) 0.083% IN NEBU: 2.5000 mg | INHALATION_SOLUTION | RESPIRATORY_TRACT | Status: DC | PRN

## 2017-08-14 MED ORDER — CARVEDILOL 12.5 MG PO TABS
12.5000 mg | ORAL_TABLET | Freq: Two times a day (BID) | ORAL | Status: DC
  Administered 2017-08-15 – 2017-08-17 (×5): 12.5 mg via ORAL
  Filled 2017-08-14 (×6): qty 1

## 2017-08-14 MED ORDER — IPRATROPIUM-ALBUTEROL 0.5-2.5 (3) MG/3ML IN SOLN
3.0000 mL | Freq: Once | RESPIRATORY_TRACT | Status: AC
Start: 2017-08-14 — End: 2017-08-14
  Administered 2017-08-14: 3 mL via RESPIRATORY_TRACT
  Filled 2017-08-14: qty 3

## 2017-08-14 MED ORDER — FUROSEMIDE 10 MG/ML IJ SOLN
20.0000 mg | Freq: Two times a day (BID) | INTRAMUSCULAR | Status: DC
  Administered 2017-08-15 – 2017-08-16 (×3): 20 mg via INTRAVENOUS
  Filled 2017-08-14 (×3): qty 2

## 2017-08-14 NOTE — ED Triage Notes (Addendum)
Pt reports shortness of breath for last few days. Pt reports intermittent productive cough and generalized weakness. Pt denies pain but reports "tightness in chest." pt alert with moderate wheezing and dyspnea noted at rest. Pt placed on 2 liters [place] for comfort. Room air saturation 90-92%.  Per EMS, duoneb administered en route. Pt reports improvement in breathing post treatment.

## 2017-08-14 NOTE — ED Notes (Signed)
Pt O2 stayed between 88 - 90% while ambulating. No complaint of dizziness, shortness of breath noted.

## 2017-08-14 NOTE — ED Notes (Signed)
Unable to obtain lab from IV access.

## 2017-08-14 NOTE — H&P (Signed)
History and Physical    Tracy BrasilShirley W Tuminello JXB:[id number] DOB: 11/25/1945 DOA: 08/14/2017  PCP: Preston Fleetingevelo, Adrian Mancheno, MD   Patient coming from: Home  Chief Complaint: SOB, cough, gen weakness   HPI: Tracy Webster is a 71 y.o. 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  The patient reports that she had been in her usual state of health until the insidious development of dyspnea several days ago.  She has occasionally experienced a productive cough with this, but denies fevers, chills, chest pain, palpitations, rhinorrhea, sore throat, sick contacts, prolonged immobilization, or leg tenderness.  She has not attempted any interventions for her symptoms.  She called EMS, was noted to have wheezing, treated with DuoNeb, and transported to the hospital.  ED Course: Upon arrival to the ED, patient is found to be afebrile, saturating 96% on room air, mildly tachypneic, and with vitals otherwise stable.  EKG features a sinus rhythm with low voltage QRS and chest x-ray is notable for pulmonary vascular congestion.  Chemistry panel reveals a BUN of 40 and creatinine of 2.14, up from 1.6 last August and 2-range prior to that.  Chemistry panel also reveals a serum glucose of 34.  Patient was treated with 50% dextrose and 20 mg IV Lasix in the ED.  Supplemental oxygen was provided.  She is hemodynamically stable, not in acute distress, and will be admitted to the telemetry unit for ongoing evaluation and management of acute hypoxic respiratory failure suspected secondary to acute on chronic CHF.  Review of Systems:  All other systems reviewed and apart from HPI, are negative.  Past Medical History:  Diagnosis Date  . CKD (chronic kidney disease)   . Diabetes mellitus without complication (HCC)    . GERD (gastroesophageal reflux disease)   . Hypertension     History reviewed. No pertinent surgical history.   reports that  has never smoked. she has never used smokeless tobacco. She reports that she does not drink alcohol. Her drug history is not on file.  Allergies  Allergen Reactions  . Tetracyclines & Related     History reviewed. No pertinent family history.   Prior to Admission medications   Medication Sig Start Date End Date Taking? Authorizing Provider  carvedilol (COREG) 12.5 MG tablet Take 12.5 mg by mouth 2 (two) times daily with a meal.   Yes [provider]  diltiazem (CARDIZEM CD) 300 MG 24 hr capsule Take 300 mg by mouth daily.   Yes [provider]  docusate sodium (COLACE) 100 MG capsule Take 100 mg by mouth 2 (two) times daily.   Yes [provider]  fexofenadine (ALLEGRA) 180 MG tablet Take 180 mg by mouth daily as needed for allergies.    Yes [provider]  furosemide (LASIX) 20 MG tablet Take 20 mg by mouth 2 (two) times daily.    Yes [provider]  insulin aspart protamine - aspart (NOVOLOG MIX 70/30 FLEXPEN) (70-30) 100 UNIT/ML FlexPen Inject 40 Units into the skin 2 (two) times daily.   Yes [provider]  isosorbide mononitrate (IMDUR) 60 MG 24 hr tablet Take 60 mg by mouth every morning.   Yes [provider]  lisinopril (PRINIVIL,ZESTRIL) 40 MG tablet Take 1 tablet by mouth daily. 09/22/15  Yes [provider]  lovastatin (MEVACOR) 20 MG tablet Take 1  tablet by mouth every evening.    Yes [provider]  Multiple Vitamins-Minerals (CENTRUM SILVER) tablet Take 1 tablet by mouth daily.   Yes [provider]  ranitidine (ZANTAC) 150 MG capsule Take 1 capsule (150 mg total) by mouth 2 (two) times daily. 02/15/16  Yes Sharman Cheek, MD  acetaminophen (TYLENOL) 500 MG tablet Take 1,000 mg by mouth every 6 (six) hours as needed for mild pain, moderate pain or  fever.     [provider]    Physical Exam: Vitals:   08/14/17 1656 08/14/17 1700 08/14/17 1730 08/14/17 1900  BP:  (!) 138/57 (!) 126/59 (!) 144/60  Pulse:  61 (!) 55 (!) 52  Resp:  19 20 (!) 22  Temp:      TempSrc:      SpO2: 93% 100% 93% (!) 86%  Weight:      Height:          Constitutional: No acute distress, calm, obese Eyes: PERTLA, lids and conjunctivae normal ENMT: Mucous membranes are moist. Posterior pharynx clear of any exudate or lesions.   Neck: normal, supple, no masses, no thyromegaly Respiratory: mild tachypnea and dyspnea with speech, no wheezing, no rhonchi. No accessory muscle use.  Cardiovascular: S1 & S2 heard, regular rate and rhythm. No significant JVD. Abdomen: No distension, no tenderness, no masses palpated. Bowel sounds normal.  Musculoskeletal: no clubbing / cyanosis. No joint deformity upper and lower extremities.  Skin: no significant rashes, lesions, ulcers. Warm, dry, well-perfused. Neurologic: CN 2-12 grossly intact. Sensation intact. Strength 5/5 in all 4 limbs.  Psychiatric: Alert and oriented x 3. Calm, cooperative.     Labs on Admission: I have personally reviewed following labs and imaging studies  CBC: Recent Labs  Lab 08/14/17 1647  WBC 5.9  NEUTROABS 4.0  HGB 11.6*  HCT 38.1  MCV 96.2  PLT 230   Basic Metabolic Panel: Recent Labs  Lab 08/14/17 1647  NA 143  K 3.6  CL 105  CO2 30  GLUCOSE 34*  BUN 40*  CREATININE 2.14*  CALCIUM 8.4*   GFR: Estimated Creatinine Clearance: 22.8 mL/min (A) (by C-G formula based on SCr of 2.14 mg/dL (H)). Liver Function Tests: No results for input(s): AST, ALT, ALKPHOS, BILITOT, PROT, ALBUMIN in the last 168 hours. No results for input(s): LIPASE, AMYLASE in the last 168 hours. No results for input(s): AMMONIA in the last 168 hours. Coagulation Profile: No results for input(s): INR, PROTIME in the last 168 hours. Cardiac Enzymes: Recent Labs  Lab 08/14/17 1647    TROPONINI <0.03   BNP (last 3 results) No results for input(s): PROBNP in the last 8760 hours. HbA1C: No results for input(s): HGBA1C in the last 72 hours. CBG: Recent Labs  Lab 08/14/17 1744 08/14/17 1802  GLUCAP 29* 164*   Lipid Profile: No results for input(s): CHOL, HDL, LDLCALC, TRIG, CHOLHDL, LDLDIRECT in the last 72 hours. Thyroid Function Tests: No results for input(s): TSH, T4TOTAL, FREET4, T3FREE, THYROIDAB in the last 72 hours. Anemia Panel: No results for input(s): VITAMINB12, FOLATE, FERRITIN, TIBC, IRON, RETICCTPCT in the last 72 hours. Urine analysis:    Component Value Date/Time   COLORURINE STRAW (A) 10/14/2015 0914   APPEARANCEUR CLEAR (A) 10/14/2015 0914   LABSPEC 1.004 (L) 10/14/2015 0914   PHURINE 5.0 10/14/2015 0914   GLUCOSEU NEGATIVE 10/14/2015 0914   HGBUR 2+ (A) 10/14/2015 0914   BILIRUBINUR NEGATIVE 10/14/2015 0914   KETONESUR NEGATIVE 10/14/2015 0914   PROTEINUR NEGATIVE 10/14/2015 0914  NITRITE NEGATIVE 10/14/2015 0914   LEUKOCYTESUR NEGATIVE 10/14/2015 0914   Sepsis Labs: @LABRCNTIP (procalcitonin:4,lacticidven:4) )No results found for this or any previous visit (from the past 240 hour(s)).   Radiological Exams on Admission: Dg Chest Portable 1 View  Result Date: 08/14/2017 CLINICAL DATA:  Shortness of Breath EXAM: PORTABLE CHEST 1 VIEW COMPARISON:  None. FINDINGS: Cardiac shadow is enlarged. Vascular congestion is noted as well some right basilar atelectatic changes. No acute bony abnormality is seen. IMPRESSION: Vascular congestion and right basilar atelectatic changes Electronically Signed   By: Alcide CleverMark  Lukens M.D.   On: 08/14/2017 17:11    EKG: Independently reviewed. Sinus rhythm, low-voltage QRS.   Assessment/Plan  1. Acute hypoxic respiratory failure  - Pt presents with several days of SOB, noted to be hypoxic in ED to 86%  - No fever or leukocytosis and pt denies rhinorrhea, sore throat, aches, or sick contacts  - There was  wheezing reported by EMS that resolved with DuoNeb  - No anginal complaints and no leg tenderness or PE risk-factors - Suspect this is likely secondary to acute on chronic CHF     2. Acute on chronic CHF  - Pt carries diagnosis of CHF and takes Lasix 20 mg BID at home, as well as Coreg and lisinopril, but no echo on file  - BNP is normal in ED, but there is some mild peripheral edema and vascular congestion on CXR - She was treated with Lasix 20 mg IV in ED  [facility] follow daily wts and I/O's, continue diuresis with Lasix 20 mg IV q12h, continue Coreg, hold lisinopril initially in light of elevated creatinine with unclear baseline, check echocardiogram, and daily chem panel during diuresis    3. CKD stage IV  - SCr is 2.14 on admission; was 1.6 in August '18 and close to 2 prior to that  - With baseline not entirely clear, will hold lisinopril initially, follow daily chem panel    4. Hypoglycemia; IDDM  - Serum glucose 34 on presentation, CBG 29  - Pt reports taking her usual dose of Novolog 70/30 40 units around mid-day and ate lunch as usual  - She was treated with D50% and fed, CBG normalized  - Plan to follow CBG's and start with a low-intensity SSI only, adjust as needed - No A1c on file, will check     DVT prophylaxis: sq heparin  Code Status: Full  Family Communication: Discussed with patient Disposition Plan: Admit to telemetry Consults called: None Admission status: Inpatient    Briscoe Deutscherimothy S , MD [facility] Pager [phone]  If 7PM-7AM, please contact night-coverage [email or web address] Password [email or web address]  08/14/2017, 7:25 PM

## 2017-08-14 NOTE — ED Provider Notes (Signed)
[facility] EMERGENCY DEPARTMENT Provider Note   CSN: [id number] Arrival date & time: 08/14/17  1627     History   Chief Complaint Chief Complaint  Patient presents with  . Shortness of Breath    HPI Tracy Webster is a 71 y.o. female.  HPI  Pt was seen at 1640.  Per pt, c/o gradual onset and persistence of constant SOB and cough for the past few days, worse today. Has been associated with generalized weakness, nausea, and "tightness" in her chest. EMS gave duoneb en route with "some" improvement in her symptoms. Pt denies CP/palpitations, no fevers, no back pain, no abd pain, no vomiting/diarrhea.   Past Medical History:  Diagnosis Date  . CKD (chronic kidney disease)   . Diabetes mellitus without complication (HCC)   . GERD (gastroesophageal reflux disease)   . Hypertension     There are no active problems to display for this patient.   History reviewed. No pertinent surgical history.  OB History    No data available       Home Medications    Prior to Admission medications   Medication Sig Start Date End Date Taking? Authorizing Provider  acetaminophen (TYLENOL) 500 MG tablet Take 1,000 mg by mouth every 6 (six) hours as needed for mild pain, moderate pain or fever.     [provider]  diltiazem (CARDIZEM CD) 300 MG 24 hr capsule Take 300 mg by mouth daily.    [provider]  docusate sodium (COLACE) 100 MG capsule Take 100 mg by mouth 2 (two) times daily.    [provider]  fexofenadine (ALLEGRA) 180 MG tablet Take 180 mg by mouth daily as needed for allergies.     [provider]  furosemide (LASIX) 20 MG tablet Take 20 mg by mouth every morning.    [provider]  lisinopril (PRINIVIL,ZESTRIL) 40 MG tablet Take 1 tablet by mouth daily. 09/22/15   [provider]  lovastatin (MEVACOR) 20 MG tablet Take 1 tablet by mouth every evening.     [provider]  Multiple Vitamins-Minerals (CENTRUM  SILVER) tablet Take 1 tablet by mouth daily.    [provider]  ondansetron (ZOFRAN) 4 MG tablet Take 1 tablet (4 mg total) by mouth daily as needed for nausea or vomiting. 10/14/15   Emily Filbert, MD  ranitidine (ZANTAC) 150 MG capsule Take 1 capsule (150 mg total) by mouth 2 (two) times daily. 02/15/16   Sharman Cheek, MD  sucralfate (CARAFATE) 1 g tablet Take 1 tablet (1 g total) by mouth 4 (four) times daily. 02/15/16   Sharman Cheek, MD    Family History History reviewed. No pertinent family history.  Social History Social History   Tobacco Use  . Smoking status: Never Smoker  . Smokeless tobacco: Never Used  Substance Use Topics  . Alcohol use: No  . Drug use: Not on file     Allergies   Tetracyclines & related   Review of Systems Review of Systems ROS: Statement: All systems negative except as marked or noted in the HPI; Constitutional: Negative for fever and chills. +generalized weakness.; ; Eyes: Negative for eye pain, redness and discharge. ; ; ENMT: Negative for ear pain, hoarseness, nasal congestion, sinus pressure and sore throat. ; ; Cardiovascular: Negative for chest pain, palpitations, diaphoresis, and peripheral edema. ; ; Respiratory: +SOB, cough. Negative for wheezing and stridor. ; ; Gastrointestinal: +nausea. Negative for vomiting, diarrhea, abdominal pain, blood in stool, hematemesis, jaundice  and rectal bleeding. . ; ; Genitourinary: Negative for dysuria, flank pain and hematuria. ; ; Musculoskeletal: Negative for back pain and neck pain. Negative for swelling and trauma.; ; Skin: Negative for pruritus, rash, abrasions, blisters, bruising and skin lesion.; ; Neuro: Negative for headache, lightheadedness and neck stiffness. Negative for altered level of consciousness, altered mental status, extremity weakness, paresthesias, involuntary movement, seizure and syncope.      Physical Exam Updated Vital Signs BP (!) 133/56 (BP Location: Right  Arm)   Pulse (!) 57   Temp 98.4 F (36.9 C) (Oral)   Resp (!) 21   Ht 5' (1.524 m)   Wt 81.6 kg (180 lb)   SpO2 90% Comment: 92% consistently on 2 liters .  BMI 35.15 kg/m   Patient Vitals for the past 24 hrs:  BP Temp Temp src Pulse Resp SpO2 Height Weight  08/14/17 1900 (!) 144/60 - - (!) 52 (!) 22 (!) 86 % - -  08/14/17 1730 (!) 126/59 - - (!) 55 20 93 % - -  08/14/17 1700 (!) 138/57 - - 61 19 100 % - -  08/14/17 1656 - - - - - 93 % - -  08/14/17 1641 - - - - - - 5' (1.524 m) 81.6 kg (180 lb)  08/14/17 1640 (!) 133/56 98.4 F (36.9 C) Oral (!) 57 (!) 21 90 % - -     Physical Exam 1645: Physical examination:  Nursing notes reviewed; Vital signs and O2 SAT reviewed;  Constitutional: Well developed, Well nourished, Well hydrated, In no acute distress; Head:  Normocephalic, atraumatic; Eyes: EOMI, PERRL, No scleral icterus; ENMT: Mouth and pharynx normal, Mucous membranes moist; Neck: Supple, Full range of motion, No lymphadenopathy; Cardiovascular: Regular rate and rhythm, No gallop; Respiratory: Breath sounds diminished & equal bilaterally, insp/exp wheezes bilat. +audible wheezing.  Speaking short sentences, Normal respiratory effort/excursion; Chest: Nontender, Movement normal; Abdomen: Soft, Nontender, Nondistended, Normal bowel sounds; Genitourinary: No CVA tenderness; Extremities: Pulses normal, No tenderness, No edema, No calf edema or asymmetry.; Neuro: AA&Ox3, Major CN grossly intact.  Speech clear. No gross focal motor deficits in extremities.; Skin: Color normal, Warm, Dry.   ED Treatments / Results  Labs (all labs ordered are listed, but only abnormal results are displayed)   EKG  EKG Interpretation  Date/Time:  Monday August 14 2017 16:37:25 EST Ventricular Rate:  59 PR Interval:    QRS Duration: 106 QT Interval:  433 QTC Calculation: 429 R Axis:   88 Text Interpretation:  Sinus rhythm Low voltage with right axis deviation Baseline wander When compared with  ECG of 02/15/2016, 11/12/2013 No significant change was found Confirmed by Samuel JesterMcManus,  [phone]([id number]) on 08/14/2017 4:49:03 PM       Radiology   Procedures Procedures (including critical care time)  Medications Ordered in ED Medications  ipratropium-albuterol (DUONEB) 0.5-2.5 (3) MG/3ML nebulizer solution 3 mL (not administered)     Initial Impression / Assessment and Plan / ED Course  I have reviewed the triage vital signs and the nursing notes.  Pertinent labs & imaging results that were available during my care of the patient were reviewed by me and considered in my medical decision making (see chart for details).  MDM Reviewed: previous chart, nursing note and vitals Reviewed previous: labs and ECG Interpretation: labs, ECG and x-ray   Results for orders placed or performed during the hospital encounter of 08/14/17  Troponin I  Result Value Ref Range   Troponin I <0.03 <0.03 ng/mL  CBC with Differential  Result Value Ref Range   WBC 5.9 4.0 - 10.5 K/uL   RBC 3.96 3.87 - 5.11 MIL/uL   Hemoglobin 11.6 (L) 12.0 - 15.0 g/dL   HCT 16.1 09.6 - 04.5 %   MCV 96.2 78.0 - 100.0 fL   MCH 29.3 26.0 - 34.0 pg   MCHC 30.4 30.0 - 36.0 g/dL   RDW 40.9 81.1 - 91.4 %   Platelets 230 150 - 400 K/uL   Neutrophils Relative % 68 %   Neutro Abs 4.0 1.7 - 7.7 K/uL   Lymphocytes Relative 16 %   Lymphs Abs 1.0 0.7 - 4.0 K/uL   Monocytes Relative 14 %   Monocytes Absolute 0.8 0.1 - 1.0 K/uL   Eosinophils Relative 1 %   Eosinophils Absolute 0.0 0.0 - 0.7 K/uL   Basophils Relative 1 %   Basophils Absolute 0.0 0.0 - 0.1 K/uL  Basic metabolic panel  Result Value Ref Range   Sodium 143 135 - 145 mmol/L   Potassium 3.6 3.5 - 5.1 mmol/L   Chloride 105 101 - 111 mmol/L   CO2 30 22 - 32 mmol/L   Glucose, Bld 34 (LL) 65 - 99 mg/dL   BUN 40 (H) 6 - 20 mg/dL   Creatinine, Ser 7.82 (H) 0.44 - 1.00 mg/dL   Calcium 8.4 (L) 8.9 - 10.3 mg/dL   GFR calc non Af Amer 22 (L) >60 mL/min   GFR calc Af  Amer 26 (L) >60 mL/min   Anion gap 8 5 - 15  Brain natriuretic peptide  Result Value Ref Range   B Natriuretic Peptide 66.0 0.0 - 100.0 pg/mL  CBG monitoring, ED  Result Value Ref Range   Glucose-Capillary 29 (LL) 65 - 99 mg/dL   Comment 1 Notify RN    Dg Chest Portable 1 View Result Date: 08/14/2017 CLINICAL DATA:  Shortness of Breath EXAM: PORTABLE CHEST 1 VIEW COMPARISON:  None. FINDINGS: Cardiac shadow is enlarged. Vascular congestion is noted as well some right basilar atelectatic changes. No acute bony abnormality is seen. IMPRESSION: Vascular congestion and right basilar atelectatic changes Electronically Signed   By: Alcide Clever M.D.   On: 08/14/2017 17:11    Results for NAUDIA, CROSLEY (MRN [id number]) as of 08/14/2017 17:52  Ref. Range 10/14/2015 09:17 02/15/2016 09:40 08/14/2017 16:47  BUN Latest Ref Range: 6 - 20 mg/dL 36 (H) 27 (H) 40 (H)  Creatinine Latest Ref Range: 0.44 - 1.00 mg/dL 5.78 (H) 4.69 (H) 6.29 (H)    1910:  Pt given 2 short nebs total with lungs diminished bilat, no further wheezing. Pt then ambulated: Sats dropped to 88-90% and pt c/o SOB. Pt's BNP normal but CXR with vascular congestion, low dose IV lasix given. No hx COPD/asthma. Pt's glucose low on BMP, re-checked with CBG. IV D50 given with improvement. Pt states she ate a sandwich at noon today right after she gave herself her 70/30 insulin.  Dx and testing d/w pt.  Questions answered.  Verb understanding, agreeable to admit. T/C to [facility]. Antionette Char, case discussed, including:  HPI, pertinent PM/SHx, VS/PE, dx testing, ED course and treatment:  Agreeable to admit.   2035:  Called to pt's ED room by RN:  Pt ambulated to bathroom and when returned had low O2 Sat with diaphoresis. On my arrival to exam room, pt was awake/alert, HR 70's, SBP 180's, Sats 100% on NRB O2, lungs with insp/exp wheezes bilat, faint audible wheezing. CBG 170. Hour long neb  ordered. T/C to [facility] Dr. Antionette Charpyd with update.    Final Clinical  Impressions(s) / ED Diagnoses   Final diagnoses:  None    ED Discharge Orders    None       Samuel JesterMcManus, , DO 08/18/17 1617

## 2017-08-14 NOTE — ED Notes (Signed)
Pt ambulated to the restroom, upon returning to the room. Assisted pt back into bed. At that point pt stopped responding, had a slight cough and was just looking forward. Pt placed on NRB mask, EDP called to room. CBG checked. Pt placed back on monitor, NSR on monitor O2 sats 100%. EDP & hospitalist notified.

## 2017-08-15 ENCOUNTER — Inpatient Hospital Stay (HOSPITAL_COMMUNITY): Payer: Medicare HMO

## 2017-08-15 DIAGNOSIS — N183 Chronic kidney disease, stage 3 unspecified: Secondary | ICD-10-CM

## 2017-08-15 DIAGNOSIS — E161 Other hypoglycemia: Secondary | ICD-10-CM

## 2017-08-15 DIAGNOSIS — I509 Heart failure, unspecified: Secondary | ICD-10-CM

## 2017-08-15 DIAGNOSIS — J9801 Acute bronchospasm: Secondary | ICD-10-CM

## 2017-08-15 DIAGNOSIS — R0600 Dyspnea, unspecified: Secondary | ICD-10-CM

## 2017-08-15 DIAGNOSIS — I5031 Acute diastolic (congestive) heart failure: Secondary | ICD-10-CM

## 2017-08-15 DIAGNOSIS — J9600 Acute respiratory failure, unspecified whether with hypoxia or hypercapnia: Secondary | ICD-10-CM

## 2017-08-15 LAB — BASIC METABOLIC PANEL
ANION GAP: 7 (ref 5–15)
BUN: 41 mg/dL — ABNORMAL HIGH (ref 6–20)
CHLORIDE: 104 mmol/L (ref 101–111)
CO2: 29 mmol/L (ref 22–32)
CREATININE: 2.1 mg/dL — AB (ref 0.44–1.00)
Calcium: 8.3 mg/dL — ABNORMAL LOW (ref 8.9–10.3)
GFR calc non Af Amer: 23 mL/min — ABNORMAL LOW (ref >60)
GFR, EST AFRICAN AMERICAN: 26 mL/min — AB (ref >60)
Glucose, Bld: 84 mg/dL (ref 65–99)
POTASSIUM: 4.1 mmol/L (ref 3.5–5.1)
SODIUM: 140 mmol/L (ref 135–145)

## 2017-08-15 LAB — PROCALCITONIN: Procalcitonin: 0.45 ng/mL

## 2017-08-15 LAB — GLUCOSE, CAPILLARY
Glucose-Capillary: 66 mg/dL (ref 65–99)
Glucose-Capillary: 242 mg/dL — ABNORMAL HIGH (ref 65–99)
Glucose-Capillary: 220 mg/dL — ABNORMAL HIGH (ref 65–99)
Glucose-Capillary: 332 mg/dL — ABNORMAL HIGH (ref 65–99)

## 2017-08-15 LAB — ECHOCARDIOGRAM COMPLETE
HEIGHTINCHES: 60 in
WEIGHTICAEL: 2878.68 [oz_av]

## 2017-08-15 LAB — HEMOGLOBIN A1C
Hgb A1c MFr Bld: 5.7 % — ABNORMAL HIGH (ref 4.8–5.6)
MEAN PLASMA GLUCOSE: 116.89 mg/dL

## 2017-08-15 MED ORDER — ORAL CARE MOUTH RINSE
15.0000 mL | Freq: Two times a day (BID) | OROMUCOSAL | Status: DC
Start: 2017-08-16 — End: 2017-08-17
  Administered 2017-08-16 (×2): 15 mL via OROMUCOSAL

## 2017-08-15 MED ORDER — IPRATROPIUM-ALBUTEROL 0.5-2.5 (3) MG/3ML IN SOLN
3.0000 mL | Freq: Four times a day (QID) | RESPIRATORY_TRACT | Status: DC
  Administered 2017-08-15 – 2017-08-17 (×8): 3 mL via RESPIRATORY_TRACT
  Filled 2017-08-15 (×7): qty 3

## 2017-08-15 MED ORDER — AZITHROMYCIN 250 MG PO TABS
500.0000 mg | ORAL_TABLET | ORAL | Status: DC
  Administered 2017-08-15 – 2017-08-16 (×2): 500 mg via ORAL
  Filled 2017-08-15 (×2): qty 2

## 2017-08-15 MED ORDER — DEXTROSE 5 % IV SOLN
1.0000 g | INTRAVENOUS | Status: DC
  Administered 2017-08-15 – 2017-08-16 (×2): 1 g via INTRAVENOUS
  Filled 2017-08-15 (×4): qty 10

## 2017-08-15 MED ORDER — METHYLPREDNISOLONE SODIUM SUCC 40 MG IJ SOLR
40.0000 mg | Freq: Three times a day (TID) | INTRAMUSCULAR | Status: DC
  Administered 2017-08-15 – 2017-08-16 (×3): 40 mg via INTRAVENOUS
  Filled 2017-08-15 (×3): qty 1

## 2017-08-15 MED ORDER — FAMOTIDINE 20 MG PO TABS
20.0000 mg | ORAL_TABLET | Freq: Every day | ORAL | Status: DC
  Administered 2017-08-15 – 2017-08-17 (×3): 20 mg via ORAL
  Filled 2017-08-15 (×3): qty 1

## 2017-08-15 NOTE — Progress Notes (Signed)
Inpatient Diabetes Program Recommendations  AACE/ADA: New Consensus Statement on Inpatient Glycemic Control (2015)  Target Ranges:  Prepandial:   less than 140 mg/dL      Peak postprandial:   less than 180 mg/dL (1-2 hours)      Critically ill patients:  140 - 180 mg/dL  Results for Tracy Webster, Tracy Webster (MRN [id number]) as of 08/15/2017 14:27  Ref. Range 08/14/2017 20:02 08/14/2017 20:34 08/14/2017 22:14 08/15/2017 07:46 08/15/2017 11:28  Glucose-Capillary Latest Ref Range: 65 - 99 mg/dL 409141 (H) 811170 (H) 914158 (H) 66 242 (H)   Results for Tracy Webster, Tracy Webster (MRN [id number]) as of 08/15/2017 14:27  Ref. Range 08/15/2017 05:21  Hemoglobin A1C Latest Ref Range: 4.8 - 5.6 % 5.7 (H)   Review of Glycemic Control  Diabetes history: DM2 Outpatient Diabetes medications: 70/30 40 units BID Current orders for Inpatient glycemic control: Novolog 0-9 units TID with meals, Novolog 0-5 units QHS; Solumedrol 40 mg Q8H  Inpatient Diabetes Program Recommendations: HgbA1C: A1C 5.7% on 08/14/17 indicating an average glucose of 117 mg/dl over the past 2-3 months.  With A1C of 5.7%, question if patient is experiencing frequent hypoglycemia. Patient denied any issues with hypoglycemia; question if she has hypoglycemia unawareness. Recommend patient follow up with PCP as she may need lower dose of insulin.  Insulin-Basal: If glucose is consistently greater than 180 mg/dl, please consider ordering low dose basal insulin. Recommend starting with Lantus 10 units Q24H.  NOTE: Spoke with patient about diabetes and home regimen for diabetes control. Patient reports that she is followed by PCP for diabetes management and currently she takes 70/30 40 units BID as an outpatient for diabetes control. Patient reports that she is taking insulin as prescribed.  Inquired about whether patient ever takes less or more of 70/30 insulin and she denies any changes and consistently takes 40 units BID. Patient states that her glucose "usually  runs good."  Inquired about hypoglycemia at home and patient reports that she rarely experiences any issues with hypoglycemia.   Inquired about prior A1C and patient reports that her PCP checks A1C but she does not remember what it was the last time it was checked.  A1C was not resulted at time of discussion with patient.  Discussed glucose and A1C goals. Discussed importance of checking CBGs and maintaining good CBG control to prevent long-term and short-term complications. Explained how hyperglycemia leads to damage within blood vessels which lead to the common complications seen with uncontrolled diabetes. Discussed hypoglycemia along with proper treatment. Stressed to the patient the importance of improving glycemic control to prevent further complications from uncontrolled diabetes. Discussed impact of nutrition, exercise, stress, sickness, and medications on diabetes control. Encouraged patient to check her glucose 3-4 times per day (before meals and at bedtime) and any other time she had symptoms of hypoglycemia. Encouraged patient to call PCP if she has any issues with glucose less than 70 mg/dl to inquire about whether she needs to adjust insulin dosage.   Patient verbalized understanding of information discussed and dhe states that dhe has no further questions at this time related to diabetes.  Thanks, Orlando PennerMarie , RN, MSN, CDE Diabetes Coordinator Inpatient Diabetes Program [phone] (Team Pager)

## 2017-08-15 NOTE — Progress Notes (Addendum)
PROGRESS NOTE  Tracy Webster LOV:[id number] DOB: 1945/12/27 DOA: 08/14/2017 PCP: Tracy Webster, Tracy Mancheno, MD  Brief History:  71 year old female with a history of CKD stage III, diabetes mellitus, hypertension, GERD presenting with 1 week history of shortness of breath nonproductive cough and generalized weakness.  She denies any fevers, chills, chest pain, hemoptysis, nausea, vomiting, diarrhea.  She denies any sick contacts.  There is been no prolonged immobilization or long trips.  The patient has never smoked.  Upon presentation, the patient was noted to be hypoxic with oxygen saturation 90% on room air.  She was afebrile and hemodynamically stable.  Chest x-ray showed vascular congestion and basilar atelectasis.  The patient was started on intravenous furosemide 20 mg IV twice daily.  Assessment/Plan: Acute respiratory failure with hypoxia -Presently stable on 2 L -While the patient may have a degree of pulmonary edema/CHF, her respiratory failure is multifactorial secondary to her OHS/OSA, and possible degree of undiagnosed COPD -CT chest without contrast  Acute  Diastolic CHF -continue IV Lasix -daily weights -strict I/Os -Echo--EF 60-65%, indeterminant diastolic dysfxn  Bronchospasm -12/11--significant wheezing on exam  -start duo nebs -start IV steroids -suspect undiagnosed asthma/COPD  Acute on chronic renal failure --CKD stage III -Baseline creatinine 1.5-1.7 -A.m. BMP -Discontinue lisinopril  Diabetes mellitus type 2 -The patient was hypoglycemic upon presentation -Suspect this is due to longer half-life of insulin secondary to worsening renal function  -holding 70/30 -novolog sliding scale -Hemoglobin A1c  Essential hypertension -Continue carvedilol, diltiazem, Imdur -Discontinue lisinopril as discussed above  Hyperlipidemia -Continue statin  Anemia of CKD -Baseline hemoglobin~12   Disposition Plan:   Home in 2-3 days  Family Communication:    Family at bedside  Consultants:  none  Code Status:  FULL   DVT Prophylaxis:  [place] Heparin   Procedures: As Listed in Progress Note Above  Antibiotics: None    Subjective: The patient states that she is breathing a lot better but still remains short of breath.  She denies any fevers, chills, chest pain, nausea, vomiting, diarrhea.  She denies any headache or neck pain.  There is no dysuria or hematuria.  No abdominal pain.  Objective: Vitals:   08/14/17 2211 08/14/17 2308 08/15/17 0628 08/15/17 0629  BP: (!) 148/58  (!) 144/61   Pulse: (!) 56 (!) 58 60   Resp: 20 20 20    Temp: 98.2 F (36.8 C)  98.2 F (36.8 C)   TempSrc: Oral  Oral   SpO2: 92% 97% 96%   Weight: 81.6 kg (179 lb 14.7 oz)   81.6 kg (179 lb 14.7 oz)  Height: 5' (1.524 m)       Intake/Output Summary (Last 24 hours) at 08/15/2017 1021 Last data filed at 08/15/2017 0800 Gross per 24 hour  Intake 300 ml  Output 900 ml  Net -600 ml   Weight change:  Exam:   General:  Pt is alert, follows commands appropriately, not in acute distress  HEENT: No icterus, No thrush, No neck mass, [place]/AT  Cardiovascular: RRR, S1/S2, no rubs, no gallops  Respiratory: Bibasilar crackles.  Bilateral expiratory wheeze.  Abdomen: Soft/+BS, non tender, non distended, no guarding  Extremities: trace LE edema, No lymphangitis, No petechiae, No rashes, no synovitis   Data Reviewed: I have personally reviewed following labs and imaging studies Basic Metabolic Panel: Recent Labs  Lab 08/14/17 1647 08/15/17 0521  NA 143 140  K 3.6 4.1  CL 105 104  CO2  30 29  GLUCOSE 34* 84  BUN 40* 41*  CREATININE 2.14* 2.10*  CALCIUM 8.4* 8.3*   Liver Function Tests: No results for input(s): AST, ALT, ALKPHOS, BILITOT, PROT, ALBUMIN in the last 168 hours. No results for input(s): LIPASE, AMYLASE in the last 168 hours. No results for input(s): AMMONIA in the last 168 hours. Coagulation Profile: No results for input(s): INR,  PROTIME in the last 168 hours. CBC: Recent Labs  Lab 08/14/17 1647  WBC 5.9  NEUTROABS 4.0  HGB 11.6*  HCT 38.1  MCV 96.2  PLT 230   Cardiac Enzymes: Recent Labs  Lab 08/14/17 1647  TROPONINI <0.03   BNP: Invalid input(s): POCBNP CBG: Recent Labs  Lab 08/14/17 1802 08/14/17 2002 08/14/17 2034 08/14/17 2214 08/15/17 0746  GLUCAP 164* 141* 170* 158* 66   HbA1C: No results for input(s): HGBA1C in the last 72 hours. Urine analysis:    Component Value Date/Time   COLORURINE STRAW (A) 10/14/2015 0914   APPEARANCEUR CLEAR (A) 10/14/2015 0914   LABSPEC 1.004 (L) 10/14/2015 0914   PHURINE 5.0 10/14/2015 0914   GLUCOSEU NEGATIVE 10/14/2015 0914   HGBUR 2+ (A) 10/14/2015 0914   BILIRUBINUR NEGATIVE 10/14/2015 0914   KETONESUR NEGATIVE 10/14/2015 0914   PROTEINUR NEGATIVE 10/14/2015 0914   NITRITE NEGATIVE 10/14/2015 0914   LEUKOCYTESUR NEGATIVE 10/14/2015 0914   Sepsis Labs: @LABRCNTIP (procalcitonin:4,lacticidven:4) )No results found for this or any previous visit (from the past 240 hour(s)).   Scheduled Meds: . carvedilol  12.5 mg Oral BID WC  . diltiazem  300 mg Oral Daily  . docusate sodium  100 mg Oral BID  . famotidine  20 mg Oral Daily  . furosemide  20 mg Intravenous Q12H  . heparin  5,000 Units Subcutaneous Q8H  . insulin aspart  0-5 Units Subcutaneous QHS  . insulin aspart  0-9 Units Subcutaneous TID WC  . isosorbide mononitrate  60 mg Oral q morning - 10a  . [START ON 08/16/2017] mouth rinse  15 mL Mouth Rinse BID  . multivitamin with minerals  1 tablet Oral Daily  . potassium chloride  10 mEq Oral BID  . pravastatin  20 mg Oral q1800  . sodium chloride flush  3 mL Intravenous Q12H   Continuous Infusions: . sodium chloride      Procedures/Studies: Dg Chest Portable 1 View  Result Date: 08/14/2017 CLINICAL DATA:  Shortness of Breath EXAM: PORTABLE CHEST 1 VIEW COMPARISON:  None. FINDINGS: Cardiac shadow is enlarged. Vascular congestion is noted  as well some right basilar atelectatic changes. No acute bony abnormality is seen. IMPRESSION: Vascular congestion and right basilar atelectatic changes Electronically Signed   By: Alcide CleverMark  Lukens M.D.   On: 08/14/2017 17:11    Tracy Webster , DO  [facility] Pager [phone]  If 7PM-7AM, please contact night-coverage [email or web address] Password [email or web address] 08/15/2017, 10:21 AM   LOS: 1 day

## 2017-08-15 NOTE — Progress Notes (Signed)
CT chest results noted -Posterior LUL masslike consolidation with air bronchograms; patchy density LLL -borderline enlarged prevascular LNs -??PNA -check procalcitonin -start ceftriaxone and azithro  DTat

## 2017-08-15 NOTE — Care Management Note (Signed)
Case Management Note  Patient Details  Name: Tracy Webster MRN: [id number] Date of Birth: December 16, 1945  Subjective/Objective:      Admitted with acute CHF and ? undiagnosed COPD. Pt is from home, lives alone. Has a son who lives in [place] that is supportive but she would not want to ask for help. She has neighbors and friends she sees regularly. Pt is ind with ALD's. She has no HH or DME needs pta. She has PCP, drives herself to appointments. She reports compliance with medications. She reports not being on any special diet. She is on oxygen acutely. She has plans to return home with self care.            Action/Plan: Anticipate DC home with self care. No HH, DME or medication needs anticipated. CM will cont to follow peripherally and may also be consulted again if needs arise.   Expected Discharge Date:    08/19/2017              Expected Discharge Plan:  Home/Self Care  In-House Referral:    NA  Discharge planning Services  CM Consult  Post Acute Care Choice:  NA Choice offered to:  NA  Status of Service:  In process, will continue to follow   Malcolm MetroChildress,  [place], RN 08/15/2017, 11:19 AM

## 2017-08-15 NOTE — Progress Notes (Signed)
*  PRELIMINARY RESULTS* Echocardiogram 2D Echocardiogram has been performed. Patient refused Definity due to nausea.  Jeryl Columbialliott,  08/15/2017, 10:41 AM

## 2017-08-16 DIAGNOSIS — R0602 Shortness of breath: Secondary | ICD-10-CM

## 2017-08-16 DIAGNOSIS — R0902 Hypoxemia: Secondary | ICD-10-CM

## 2017-08-16 LAB — GLUCOSE, CAPILLARY
GLUCOSE-CAPILLARY: 238 mg/dL — AB (ref 65–99)
Glucose-Capillary: 326 mg/dL — ABNORMAL HIGH (ref 65–99)
Glucose-Capillary: 309 mg/dL — ABNORMAL HIGH (ref 65–99)
Glucose-Capillary: 351 mg/dL — ABNORMAL HIGH (ref 65–99)

## 2017-08-16 LAB — BASIC METABOLIC PANEL
Anion gap: 7 (ref 5–15)
BUN: 45 mg/dL — AB (ref 6–20)
CO2: 31 mmol/L (ref 22–32)
CREATININE: 2.3 mg/dL — AB (ref 0.44–1.00)
Calcium: 8.4 mg/dL — ABNORMAL LOW (ref 8.9–10.3)
Chloride: 98 mmol/L — ABNORMAL LOW (ref 101–111)
GFR calc Af Amer: 23 mL/min — ABNORMAL LOW (ref >60)
GFR, EST NON AFRICAN AMERICAN: 20 mL/min — AB (ref >60)
Glucose, Bld: 248 mg/dL — ABNORMAL HIGH (ref 65–99)
POTASSIUM: 4.7 mmol/L (ref 3.5–5.1)
SODIUM: 136 mmol/L (ref 135–145)

## 2017-08-16 MED ORDER — PREDNISONE 20 MG PO TABS
40.0000 mg | ORAL_TABLET | Freq: Every day | ORAL | Status: DC
  Administered 2017-08-17: 40 mg via ORAL
  Filled 2017-08-16: qty 2

## 2017-08-16 NOTE — Progress Notes (Signed)
Inpatient Diabetes Program Recommendations  AACE/ADA: New Consensus Statement on Inpatient Glycemic Control (2015)  Target Ranges:  Prepandial:   less than 140 mg/dL      Peak postprandial:   less than 180 mg/dL (1-2 hours)      Critically ill patients:  140 - 180 mg/dL  Results for Tracy Webster, Tracy Webster (MRN [id number]) as of 08/16/2017 07:25  Ref. Range 08/16/2017 05:52  Glucose Latest Ref Range: 65 - 99 mg/dL 409248 (H)   Results for Tracy Webster, Tracy Webster (MRN [id number]) as of 08/16/2017 07:25  Ref. Range 08/15/2017 07:46 08/15/2017 11:28 08/15/2017 16:23 08/15/2017 21:57  Glucose-Capillary Latest Ref Range: 65 - 99 mg/dL 66 956242 (H) 213220 (H) 086332 (H)  Results for Tracy Webster, Tracy Webster (MRN [id number]) as of 08/16/2017 07:25  Ref. Range 08/15/2017 05:21  Hemoglobin A1C Latest Ref Range: 4.8 - 5.6 % 5.7 (H)   Review of Glycemic Control  Diabetes history: DM2 Outpatient Diabetes medications: 70/30 40 units BID Current orders for Inpatient glycemic control: Novolog 0-9 units TID with meals, Novolog 0-5 units QHS; Solumedrol 40 mg Q8H  Inpatient Diabetes Program Recommendations:  HgbA1C: A1C 5.7% on 08/14/17 indicating an average glucose of 117 mg/dl over the past 2-3 months.  With A1C of 5.7%, question if patient is experiencing frequent hypoglycemia. Patient denied any issues with hypoglycemia; question if she has hypoglycemia unawareness. Recommend patient follow up with PCP as she may need lower dose of insulin.   Insulin-Basal:  Please consider ordering Lantus 10 units Q24H starting now. Insulin-Meal Coverage: Please consider ordering Novolog 3 units TID with meals for meal coverage if patient eats at least 50% of meals.  Thanks, Orlando PennerMarie , RN, MSN, CDE Diabetes Coordinator Inpatient Diabetes Program [phone] (Team Pager from 8am to 5pm)

## 2017-08-16 NOTE — Care Management Note (Signed)
Case Management Note  Patient Details  Name: [name] MRN: [id number] Date of Birth: 16-Dec-1945  Expected Discharge Date:     08/17/2017             Expected Discharge Plan:  Home/Self Care  In-House Referral:  NA  Discharge planning Services  CM Consult  Post Acute Care Choice:  Durable Medical Equipment Choice offered to:  Patient  DME Arranged:  Oxygen, Nebulizer machine DME Agency:    [facility]  Status of Service:  In process, will continue to follow  If discussed at Long Length of Stay Meetings, dates discussed:    Additional Comments: Pt will DC home tomorrow. Will need home oxygen and neb machine is anticipated. Pt has no preference of DME provider. Bonita QuinLinda, [facility] aware of [facility] and will pull pt info form chart. Will verify need for neb machine tomorrow at DC.   Malcolm Metrohildress,  Demske, RN 08/16/2017, 3:22 PM

## 2017-08-16 NOTE — Progress Notes (Signed)
SATURATION QUALIFICATIONS: (This note is used to comply with regulatory documentation for home oxygen)  Patient Saturations on Room Air at Rest = 92%  Patient Saturations on Room Air while Ambulating = 84%  Patient Saturations on 2 Liters of oxygen while Ambulating = 93%  Please briefly explain why patient needs home oxygen: Pt desats on room air

## 2017-08-16 NOTE — Progress Notes (Signed)
PROGRESS NOTE    Tracy BrasilShirley W Webster  ZOX:[id number] DOB: 07-24-1946 DOA: 08/14/2017 PCP: Preston Fleetingevelo, Adrian Mancheno, MD    Brief Narrative:  71 year old female with a history of CKD stage III, diabetes mellitus, hypertension, GERD presenting with 1 week history of shortness of breath nonproductive cough and generalized weakness.  She denies any fevers, chills, chest pain, hemoptysis, nausea, vomiting, diarrhea.  She denies any sick contacts.  There is been no prolonged immobilization or long trips.  The patient has never smoked.  Upon presentation, the patient was noted to be hypoxic with oxygen saturation 90% on room air.  She was afebrile and hemodynamically stable.  Chest x-ray showed vascular congestion and basilar atelectasis.  The patient was started on intravenous furosemide 20 mg IV twice daily.     Assessment & Plan:   Principal Problem:   CHF, acute on chronic (HCC) Active Problems:   Insulin-requiring or dependent type II diabetes mellitus (HCC)   Hypoglycemia due to insulin   CKD (chronic kidney disease), stage IV (HCC)   Acute respiratory failure with hypoxia (HCC)   CKD (chronic kidney disease), stage III (HCC)   Acute CHF (congestive heart failure) (HCC)   Bronchospasm, acute   Acute diastolic CHF (congestive heart failure) (HCC)   Acute respiratory failure with hypoxia -Presently stable on 2 L -Likely multifactorial -Element of COPD -Ambulatory pulse ox showing a room air saturation of 84% -CT chest without contrast showing Left upper lobe masslike consolidation suggesting short-term follow-up imaging after antibiotics  Acute  Diastolic CHF -We will discontinue Lasix given elevated creatinine -Output of -657 mL since time of admission -Echo--EF 60-65%, indeterminant diastolic dysfxn  Bronchospasm -12/11--significant wheezing on exam  -start duo nebs -Transition to p.o. prednisone - underlying COPD given significant wheezing on exam as well as productive   cough  Acute on chronic renal failure --CKD stage III -Baseline creatinine 1.5-1.7 -Creatinine of 2.3 this morning -Repeat BMP in a.m.  Diabetes mellitus type 2 -The patient was hypoglycemic upon presentation -Suspect this is due to longer half-life of insulin secondary to worsening renal function  -holding 70/30 -novolog sliding scale -Hemoglobin A1c of 5.7 -We will need follow-up outpatient to decrease insulin dose given the fact that she likely has episodes of hypoglycemia at home  Essential hypertension -Continue carvedilol, diltiazem, Imdur -Discontinue lisinopril as discussed above  Hyperlipidemia -Continue statin  Anemia of CKD -Baseline hemoglobin~12     DVT prophylaxis: [place] Heparin Code Status: Full code Family Communication: No family bedside Disposition Plan: Likely discharge in a.m.   Consultants:   None  Procedures:   None  Antimicrobials:   None    Subjective: Patient seen and examined.  She reports that her breathing feels better today than it did yesterday.  Her creatinine however has continued to increase and is at 2.3 today.  On ambulatory pulse ox she was 84% on room air with ambulation.  She does voice improvement on steroids since their initiation.  Objective: Vitals:   08/16/17 0618 08/16/17 0747 08/16/17 0845 08/16/17 1416  BP: 110/78     Pulse: 62     Resp: 20     Temp: 98.7 F (37.1 C)     TempSrc: Oral     SpO2: 95% 95%  97%  Weight:   90.5 kg (199 lb 9.6 oz)   Height:        Intake/Output Summary (Last 24 hours) at 08/16/2017 1713 Last data filed at 08/15/2017 2302 Gross per 24 hour  Intake 53  ml  Output 300 ml  Net -247 ml   Filed Weights   08/14/17 2211 08/15/17 0629 08/16/17 0845  Weight: 81.6 kg (179 lb 14.7 oz) 81.6 kg (179 lb 14.7 oz) 90.5 kg (199 lb 9.6 oz)    Examination:  General exam: Appears calm and comfortable  Respiratory system: End expiratory wheezing appreciated throughout lung fields with  crackles noted in the left upper lobe Cardiovascular system: S1 & S2 heard, RRR. No JVD, murmurs, rubs, gallops or clicks. No pedal edema. Gastrointestinal system: Abdomen is nondistended, soft and nontender. No organomegaly or masses felt. Normal bowel sounds heard. Central nervous system: Alert and oriented. No focal neurological deficits. Extremities: Symmetric 5 x 5 power. Skin: No rashes, lesions or ulcers Psychiatry: Judgement and insight appear normal. Mood & affect appropriate.     Data Reviewed: I have personally reviewed following labs and imaging studies  CBC: Recent Labs  Lab 08/14/17 1647  WBC 5.9  NEUTROABS 4.0  HGB 11.6*  HCT 38.1  MCV 96.2  PLT 230   Basic Metabolic Panel: Recent Labs  Lab 08/14/17 1647 08/15/17 0521 08/16/17 0552  NA 143 140 136  K 3.6 4.1 4.7  CL 105 104 98*  CO2 30 29 31   GLUCOSE 34* 84 248*  BUN 40* 41* 45*  CREATININE 2.14* 2.10* 2.30*  CALCIUM 8.4* 8.3* 8.4*   GFR: Estimated Creatinine Clearance: 22.5 mL/min (A) (by C-G formula based on SCr of 2.3 mg/dL (H)). Liver Function Tests: No results for input(s): AST, ALT, ALKPHOS, BILITOT, PROT, ALBUMIN in the last 168 hours. No results for input(s): LIPASE, AMYLASE in the last 168 hours. No results for input(s): AMMONIA in the last 168 hours. Coagulation Profile: No results for input(s): INR, PROTIME in the last 168 hours. Cardiac Enzymes: Recent Labs  Lab 08/14/17 1647  TROPONINI <0.03   BNP (last 3 results) No results for input(s): PROBNP in the last 8760 hours. HbA1C: Recent Labs    08/15/17 0521  HGBA1C 5.7*   CBG: Recent Labs  Lab 08/15/17 1623 08/15/17 2157 08/16/17 0808 08/16/17 1155 08/16/17 1706  GLUCAP 220* 332* 238* 326* 309*   Lipid Profile: No results for input(s): CHOL, HDL, LDLCALC, TRIG, CHOLHDL, LDLDIRECT in the last 72 hours. Thyroid Function Tests: No results for input(s): TSH, T4TOTAL, FREET4, T3FREE, THYROIDAB in the last 72 hours. Anemia  Panel: No results for input(s): VITAMINB12, FOLATE, FERRITIN, TIBC, IRON, RETICCTPCT in the last 72 hours. Sepsis Labs: Recent Labs  Lab 08/15/17 0541  PROCALCITON 0.45    No results found for this or any previous visit (from the past 240 hour(s)).       Radiology Studies: Ct Chest Wo Contrast  Result Date: 08/15/2017 CLINICAL DATA:  Dyspnea.  Chronic. EXAM: CT CHEST WITHOUT CONTRAST TECHNIQUE: Multidetector CT imaging of the chest was performed following the standard protocol without IV contrast. COMPARISON:  Chest radiograph 08/14/2017 FINDINGS: Cardiovascular: Normal heart size. Aortic atherosclerosis. Calcification within the LAD, left circumflex and RCA coronary artery. No pericardial effusion. Mediastinum/Nodes: The trachea appears patent and is midline. Normal appearance of the esophagus. Pre-vascular lymph node has a short axis of 1.2 cm, image 47 of series 2. Adjacent lymph node measures 1.1 cm, image 49 of series 2 no axillary or supraclavicular adenopathy. Lungs/Pleura: No pleural effusion. Masslike area of airspace consolidation within the posterior left upper lobe measures 7.1 by 4.2 by 3.8 cm. Internal air bronchograms are identified. Patchy airspace density within the posterolateral left lower lobe identified, image 88 of  series 4. Volume loss and subsegmental atelectasis is identified in the right middle lobe and right lower lobe. Upper Abdomen: 7 mm nodule is indeterminate arising from the right adrenal gland, image 117 of series 2. Musculoskeletal: Degenerative disc disease identified within the thoracic spine. IMPRESSION: 1. Posterior left upper lobe masslike airspace consolidation is identified. If there are signs or symptoms of infection findings are likely to reflect pneumonia. Short-term follow-up imaging after appropriate antibiotic therapy is advised to ensure resolution and to exclude underlying malignancy 2. Borderline enlarged prevascular lymph nodes may be reactive in  etiology. Attention on follow-up imaging advised. 3. Aortic Atherosclerosis (ICD10-I70.0). Multi vessel coronary artery calcifications. Electronically Signed   By: Signa Kellaylor  Stroud M.D.   On: 08/15/2017 14:12        Scheduled Meds: . azithromycin  500 mg Oral Q24H  . carvedilol  12.5 mg Oral BID WC  . diltiazem  300 mg Oral Daily  . docusate sodium  100 mg Oral BID  . famotidine  20 mg Oral Daily  . heparin  5,000 Units Subcutaneous Q8H  . insulin aspart  0-5 Units Subcutaneous QHS  . insulin aspart  0-9 Units Subcutaneous TID WC  . ipratropium-albuterol  3 mL Nebulization Q6H  . isosorbide mononitrate  60 mg Oral q morning - 10a  . mouth rinse  15 mL Mouth Rinse BID  . multivitamin with minerals  1 tablet Oral Daily  . potassium chloride  10 mEq Oral BID  . pravastatin  20 mg Oral q1800  . [START ON 08/17/2017] predniSONE  40 mg Oral Q breakfast  . sodium chloride flush  3 mL Intravenous Q12H   Continuous Infusions: . sodium chloride    . cefTRIAXone (ROCEPHIN)  IV Stopped (08/15/17 2302)     LOS: 2 days    Time spent: 35 minutes    Katrinka BlazingAlex U Kadolph, MD [facility] Pager [phone]  If 7PM-7AM, please contact night-coverage [email or web address] Password [email or web address] 08/16/2017, 5:13 PM

## 2017-08-17 DIAGNOSIS — J441 Chronic obstructive pulmonary disease with (acute) exacerbation: Secondary | ICD-10-CM

## 2017-08-17 LAB — BASIC METABOLIC PANEL
Anion gap: 6 (ref 5–15)
BUN: 55 mg/dL — AB (ref 6–20)
CO2: 29 mmol/L (ref 22–32)
CREATININE: 2.18 mg/dL — AB (ref 0.44–1.00)
Calcium: 8.6 mg/dL — ABNORMAL LOW (ref 8.9–10.3)
Chloride: 100 mmol/L — ABNORMAL LOW (ref 101–111)
GFR calc Af Amer: 25 mL/min — ABNORMAL LOW (ref >60)
GFR, EST NON AFRICAN AMERICAN: 22 mL/min — AB (ref >60)
GLUCOSE: 229 mg/dL — AB (ref 65–99)
POTASSIUM: 4.8 mmol/L (ref 3.5–5.1)
SODIUM: 135 mmol/L (ref 135–145)

## 2017-08-17 LAB — GLUCOSE, CAPILLARY
GLUCOSE-CAPILLARY: 232 mg/dL — AB (ref 65–99)
Glucose-Capillary: 340 mg/dL — ABNORMAL HIGH (ref 65–99)

## 2017-08-17 MED ORDER — INSULIN ASPART PROT & ASPART (70-30 MIX) 100 UNIT/ML PEN: 30.0000 [IU] | PEN_INJECTOR | Freq: Two times a day (BID) | SUBCUTANEOUS | 11 refills | Status: DC

## 2017-08-17 MED ORDER — ALBUTEROL SULFATE (2.5 MG/3ML) 0.083% IN NEBU: 2.5000 mg | INHALATION_SOLUTION | RESPIRATORY_TRACT | 12 refills | Status: DC | PRN

## 2017-08-17 MED ORDER — IPRATROPIUM-ALBUTEROL 0.5-2.5 (3) MG/3ML IN SOLN: 3.0000 mL | Freq: Four times a day (QID) | RESPIRATORY_TRACT | 0 refills | Status: DC

## 2017-08-17 MED ORDER — CEFUROXIME AXETIL 500 MG PO TABS: 500.0000 mg | ORAL_TABLET | Freq: Two times a day (BID) | ORAL | 0 refills | Status: AC

## 2017-08-17 NOTE — Progress Notes (Signed)
Patient discharged home with personal belongings, O2 tank and nebulizer machine. IV removed and site intact.  [facility]

## 2017-08-17 NOTE — Discharge Summary (Signed)
Physician Discharge Summary  Tracy Webster ZOX:[id number] DOB: December 19, 1945 DOA: 08/14/2017  PCP: Preston Fleeting, MD  Admit date: 08/14/2017 Discharge date: 08/17/2017  Admitted From: Home Disposition: Home  Recommendations for Outpatient Follow-up:  1. Follow up with PCP in 1-2 weeks 2. Please obtain BMP/CBC in one week 3. Please follow up on the following pending results:  Home Health: None Equipment/Devices: Nebulizer machine, oxygen  Discharge Condition: Stable CODE STATUS: Full code Diet recommendation: Heart healthy carb modified  Brief/Interim Summary: 71 year old female with a history of CKD stage III, diabetes mellitus, hypertension, GERD presenting with 1 week history of shortness of breath nonproductive cough and generalized weakness. She denies any fevers, chills, chest pain, hemoptysis, nausea, vomiting, diarrhea. She denies any sick contacts. There is been no prolonged immobilization or long trips. The patient has never smoked. Upon presentation, the patient was noted to be hypoxic with oxygen saturation 90% on room air. She was afebrile and hemodynamically stable. Chest x-ray showed vascular congestion and basilar atelectasis.The patient was started on intravenous furosemide 20 mg IV twice daily.  IV Lasix was continued however patient's creatinine slowly worsened.  The Lasix was stopped and patient was transitioned over to oral antibiotics on day of discharge.  She was set up with home oxygen as well as a home nebulizer machine.  She was instructed to follow-up with her primary care physician within the next week for repeat electrolyte panel to ensure improvement in her kidney function and improvement in her respiratory status.  Of note patient required oxygen at time of discharge.  Diuresis as well as breathing treatments were both utilized during her admission to help decrease need for supplemental oxygen.  However on ambulation she required 2 L of  oxygen.  Discharge Diagnoses:  Principal Problem:   CHF, acute on chronic (HCC) Active Problems:   Insulin-requiring or dependent type II diabetes mellitus (HCC)   Hypoglycemia due to insulin   CKD (chronic kidney disease), stage IV (HCC)   Acute respiratory failure with hypoxia (HCC)   CKD (chronic kidney disease), stage III (HCC)   Acute CHF (congestive heart failure) (HCC)   Bronchospasm, acute   Acute diastolic CHF (congestive heart failure) [facility])    Discharge Instructions  Discharge Instructions    Call MD for:  difficulty breathing, headache or visual disturbances   Complete by:  As directed    Call MD for:  extreme fatigue   Complete by:  As directed    Call MD for:  hives   Complete by:  As directed    Call MD for:  persistant dizziness or light-headedness   Complete by:  As directed    Call MD for:  persistant nausea and vomiting   Complete by:  As directed    Call MD for:  severe uncontrolled pain   Complete by:  As directed    Call MD for:  temperature >100.4   Complete by:  As directed    Diet - low sodium heart healthy   Complete by:  As directed    Diet Carb Modified   Complete by:  As directed    Discharge instructions   Complete by:  As directed    Use oxygen as instructed Antibiotics as prescribed Follow-up with primary care provider on Monday   Increase activity slowly   Complete by:  As directed      Allergies as of 08/17/2017      Reactions   Tetracyclines & Related  Medication List    STOP taking these medications   furosemide 20 MG tablet Commonly known as:  LASIX     TAKE these medications   acetaminophen 500 MG tablet Commonly known as:  TYLENOL Take 1,000 mg by mouth every 6 (six) hours as needed for mild pain, moderate pain or fever.   albuterol (2.5 MG/3ML) 0.083% nebulizer solution Commonly known as:  PROVENTIL Take 3 mLs (2.5 mg total) by nebulization every 4 (four) hours as needed for wheezing or shortness of  breath.   carvedilol 12.5 MG tablet Commonly known as:  COREG Take 12.5 mg by mouth 2 (two) times daily with a meal.   cefUROXime 500 MG tablet Commonly known as:  CEFTIN Take 1 tablet (500 mg total) by mouth 2 (two) times daily for 3 days.   CENTRUM SILVER tablet Take 1 tablet by mouth daily.   diltiazem 300 MG 24 hr capsule Commonly known as:  CARDIZEM CD Take 300 mg by mouth daily.   docusate sodium 100 MG capsule Commonly known as:  COLACE Take 100 mg by mouth 2 (two) times daily.   fexofenadine 180 MG tablet Commonly known as:  ALLEGRA Take 180 mg by mouth daily as needed for allergies.   insulin aspart protamine - aspart (70-30) 100 UNIT/ML FlexPen Commonly known as:  NOVOLOG MIX 70/30 FLEXPEN Inject 0.3 mLs (30 Units total) into the skin 2 (two) times daily. What changed:  how much to take   ipratropium-albuterol 0.5-2.5 (3) MG/3ML Soln Commonly known as:  DUONEB Take 3 mLs by nebulization every 6 (six) hours for 4 days.   isosorbide mononitrate 60 MG 24 hr tablet Commonly known as:  IMDUR Take 60 mg by mouth every morning.   lisinopril 40 MG tablet Commonly known as:  PRINIVIL,ZESTRIL Take 1 tablet by mouth daily.   lovastatin 20 MG tablet Commonly known as:  MEVACOR Take 1 tablet by mouth every evening.   ranitidine 150 MG capsule Commonly known as:  ZANTAC Take 1 capsule (150 mg total) by mouth 2 (two) times daily.            Durable Medical Equipment  (From admission, onward)        Start     Ordered   08/16/17 1456  For home use only DME Nebulizer machine  Once    Question:  Patient needs a nebulizer to treat with the following condition  Answer:  COPD (chronic obstructive pulmonary disease) (HCC)   08/16/17 1455   08/16/17 1455  For home use only DME oxygen  Once    Question Answer Comment  Mode or (Route) Nasal cannula   Liters per Minute 2   Frequency Continuous (stationary and portable oxygen unit needed)   Oxygen delivery system  Gas      08/16/17 1455     Follow-up Information    Revelo, Presley Raddle, MD. Go on 08/21/2017.   Specialty:  Family Medicine Contact information: [address] [place] [address] [phone]          Allergies  Allergen Reactions  . Tetracyclines & Related     Consultations:  Diabetes education  Case management   Procedures/Studies: Ct Chest Wo Contrast  Result Date: 08/15/2017 CLINICAL DATA:  Dyspnea.  Chronic. EXAM: CT CHEST WITHOUT CONTRAST TECHNIQUE: Multidetector CT imaging of the chest was performed following the standard protocol without IV contrast. COMPARISON:  Chest radiograph 08/14/2017 FINDINGS: Cardiovascular: Normal heart size. Aortic atherosclerosis. Calcification within the LAD, left circumflex  and RCA coronary artery. No pericardial effusion. Mediastinum/Nodes: The trachea appears patent and is midline. Normal appearance of the esophagus. Pre-vascular lymph node has a short axis of 1.2 cm, image 47 of series 2. Adjacent lymph node measures 1.1 cm, image 49 of series 2 no axillary or supraclavicular adenopathy. Lungs/Pleura: No pleural effusion. Masslike area of airspace consolidation within the posterior left upper lobe measures 7.1 by 4.2 by 3.8 cm. Internal air bronchograms are identified. Patchy airspace density within the posterolateral left lower lobe identified, image 88 of series 4. Volume loss and subsegmental atelectasis is identified in the right middle lobe and right lower lobe. Upper Abdomen: 7 mm nodule is indeterminate arising from the right adrenal gland, image 117 of series 2. Musculoskeletal: Degenerative disc disease identified within the thoracic spine. IMPRESSION: 1. Posterior left upper lobe masslike airspace consolidation is identified. If there are signs or symptoms of infection findings are likely to reflect pneumonia. Short-term follow-up imaging after appropriate antibiotic therapy is advised to ensure resolution and to  exclude underlying malignancy 2. Borderline enlarged prevascular lymph nodes may be reactive in etiology. Attention on follow-up imaging advised. 3. Aortic Atherosclerosis (ICD10-I70.0). Multi vessel coronary artery calcifications. Electronically Signed   By: Signa Kellaylor  Stroud M.D.   On: 08/15/2017 14:12   Dg Chest Portable 1 View  Result Date: 08/14/2017 CLINICAL DATA:  Shortness of Breath EXAM: PORTABLE CHEST 1 VIEW COMPARISON:  None. FINDINGS: Cardiac shadow is enlarged. Vascular congestion is noted as well some right basilar atelectatic changes. No acute bony abnormality is seen. IMPRESSION: Vascular congestion and right basilar atelectatic changes Electronically Signed   By: Alcide CleverMark  Lukens M.D.   On: 08/14/2017 17:11     Subjective: Patient seen and evaluated.  She voices she is concerned she will not have a ride home today.  She has an appointment in 4 days at her primary care physician.  She has home oxygen as well as a home nebulizer machine set up for her.  She was instructed to use her oxygen every day all day until instructed by her primary care physician that she could stop.  Discharge Exam: Vitals:   08/17/17 0749 08/17/17 1350  BP:  (!) 167/67  Pulse:  68  Resp:  20  Temp:  98.1 F (36.7 C)  SpO2: 96% 92%   Vitals:   08/16/17 2137 08/17/17 0534 08/17/17 0749 08/17/17 1350  BP: (!) 165/75 (!) 166/60  (!) 167/67  Pulse: 70 61  68  Resp: 20 20  20   Temp: 98.6 F (37 C) 98 F (36.7 C)  98.1 F (36.7 C)  TempSrc: Oral Oral    SpO2: 98% 97% 96% 92%  Weight:  90.4 kg (199 lb 3.2 oz)    Height:        General: Pt is alert, awake, not in acute distress Cardiovascular: RRR, S1/S2 +, no rubs, no gallops Respiratory: End expiratory wheezing appreciated. Abdominal: Soft, NT, ND, bowel sounds + Extremities: no edema, no cyanosis    The results of significant diagnostics from this hospitalization (including imaging, microbiology, ancillary and laboratory) are listed below for  reference.     Microbiology: No results found for this or any previous visit (from the past 240 hour(s)).   Labs: BNP (last 3 results) Recent Labs    08/14/17 1647  BNP 66.0   Basic Metabolic Panel: Recent Labs  Lab 08/14/17 1647 08/15/17 0521 08/16/17 0552 08/17/17 0602  NA 143 140 136 135  K 3.6 4.1 4.7 4.8  CL 105 104 98* 100*  CO2 30 29 31 29   GLUCOSE 34* 84 248* 229*  BUN 40* 41* 45* 55*  CREATININE 2.14* 2.10* 2.30* 2.18*  CALCIUM 8.4* 8.3* 8.4* 8.6*   Liver Function Tests: No results for input(s): AST, ALT, ALKPHOS, BILITOT, PROT, ALBUMIN in the last 168 hours. No results for input(s): LIPASE, AMYLASE in the last 168 hours. No results for input(s): AMMONIA in the last 168 hours. CBC: Recent Labs  Lab 08/14/17 1647  WBC 5.9  NEUTROABS 4.0  HGB 11.6*  HCT 38.1  MCV 96.2  PLT 230   Cardiac Enzymes: Recent Labs  Lab 08/14/17 1647  TROPONINI <0.03   BNP: Invalid input(s): POCBNP CBG: Recent Labs  Lab 08/16/17 1155 08/16/17 1706 08/16/17 2142 08/17/17 0746 08/17/17 1138  GLUCAP 326* 309* 351* 232* 340*   D-Dimer No results for input(s): DDIMER in the last 72 hours. Hgb A1c Recent Labs    08/15/17 0521  HGBA1C 5.7*   Lipid Profile No results for input(s): CHOL, HDL, LDLCALC, TRIG, CHOLHDL, LDLDIRECT in the last 72 hours. Thyroid function studies No results for input(s): TSH, T4TOTAL, T3FREE, THYROIDAB in the last 72 hours.  Invalid input(s): FREET3 Anemia work up No results for input(s): VITAMINB12, FOLATE, FERRITIN, TIBC, IRON, RETICCTPCT in the last 72 hours. Urinalysis    Component Value Date/Time   COLORURINE STRAW (A) 10/14/2015 0914   APPEARANCEUR CLEAR (A) 10/14/2015 0914   LABSPEC 1.004 (L) 10/14/2015 0914   PHURINE 5.0 10/14/2015 0914   GLUCOSEU NEGATIVE 10/14/2015 0914   HGBUR 2+ (A) 10/14/2015 0914   BILIRUBINUR NEGATIVE 10/14/2015 0914   KETONESUR NEGATIVE 10/14/2015 0914   PROTEINUR NEGATIVE 10/14/2015 0914    NITRITE NEGATIVE 10/14/2015 0914   LEUKOCYTESUR NEGATIVE 10/14/2015 0914   Sepsis Labs Invalid input(s): PROCALCITONIN,  WBC,  LACTICIDVEN Microbiology No results found for this or any previous visit (from the past 240 hour(s)).   Time coordinating discharge: 35 minutes  SIGNED:   Katrinka BlazingAlex U Kadolph, MD  [facility] 08/17/2017, 2:19 PM Pager [phone] If 7PM-7AM, please contact night-coverage [email or web address] Password [email or web address]

## 2017-08-17 NOTE — Care Management Note (Signed)
Case Management Note  Patient Details  Name: Tracy Webster MRN: [id number] Date of Birth: 1945-10-16  Expected Discharge Date:  08/17/17               Expected Discharge Plan:  Home/Self Care  In-House Referral:  NA  Discharge planning Services  CM Consult  Post Acute Care Choice:  Durable Medical Equipment Choice offered to:  Patient  DME Arranged:  Oxygen, Nebulizer machine DME Agency:    [facility]  Status of Service:  Completed, signed off  Additional Comments: Discharging home today with self care. [facility] rep will deliver oxgyen and neb machine to pt room prior to DC.   Malcolm Metrohildress,  D[place]ke, RN 08/17/2017, 2:32 PM

## 2017-08-17 NOTE — Care Management Important Message (Signed)
Important Message  Patient Details  Name: [name] MRN: [id number] Date of Birth: Feb 04, 1946   Medicare Important Message Given:  Yes    Malcolm MetroChildress,  Demske, RN 08/17/2017, 2:32 PM

## 2017-08-17 NOTE — Progress Notes (Signed)
Waiting on oxygen tank and nebulizer delivery prior to discharge.

## 2017-08-17 NOTE — Plan of Care (Signed)
Pt alert and oriented x4. Able to make needs known. No complaint of pain or distress at this time. 24G to right hand saline locked. Oxygen 2L [place]. Call light within reach, bed in lowest position. Will continue to monitor.

## 2017-08-17 NOTE — Progress Notes (Signed)
Inpatient Diabetes Program Recommendations  AACE/ADA: New Consensus Statement on Inpatient Glycemic Control (2015)  Target Ranges:  Prepandial:   less than 140 mg/dL      Peak postprandial:   less than 180 mg/dL (1-2 hours)      Critically ill patients:  140 - 180 mg/dL   Results for Loretha BrasilSNIPES, Neta W (MRN [id number]) as of 08/17/2017 10:13  Ref. Range 08/16/2017 08:08 08/16/2017 11:55 08/16/2017 17:06 08/16/2017 21:42 08/17/2017 07:46  Glucose-Capillary Latest Ref Range: 65 - 99 mg/dL 409238 (H) 811326 (H) 914309 (H) 351 (H) 232 (H)   Review of Glycemic Control  Diabetes history:DM2 Outpatient Diabetes medications:70/30 40 units BID Current orders for Inpatient glycemic control:Novolog 0-9 units TID with meals, Novolog 0-5 units QHS; Prednisone 40 mg QAM Inpatient Diabetes Program Recommendations:  Insulin-Basal: Please consider ordering Lantus 10 units Q24H starting now. Insulin-Meal Coverage: Please consider ordering Novolog 3 units TID with meals for meal coverage if patient eats at least 50% of meals.  HgbA1C: A1C 5.7% on 08/14/17 indicating an average glucose of 117 mg/dl over the past 2-3 months.WithA1C of 5.7%, question if patient is experiencing frequent hypoglycemia. Patientdenied any issues with hypoglycemia; question if she has hypoglycemia unawareness. Recommend patient follow up with PCP as she may need lower dose of insulin.   Thanks, Orlando PennerMarie , RN, MSN, CDE Diabetes Coordinator Inpatient Diabetes Program [phone] (Team Pager from 8am to 5pm)

## 2017-09-11 ENCOUNTER — Observation Stay (HOSPITAL_COMMUNITY)
Admission: EM | Admit: 2017-09-11 | Discharge: 2017-09-12 | Disposition: A | Discharge status: Home / Self Care | Payer: Medicare HMO | Attending: Family Medicine | Admitting: Family Medicine

## 2017-09-11 ENCOUNTER — Encounter (HOSPITAL_COMMUNITY): Payer: Self-pay | Admitting: Pharmacy Technician

## 2017-09-11 ENCOUNTER — Emergency Department (HOSPITAL_COMMUNITY): Payer: Medicare HMO

## 2017-09-11 DIAGNOSIS — E1140 Type 2 diabetes mellitus with diabetic neuropathy, unspecified: Secondary | ICD-10-CM | POA: Diagnosis not present

## 2017-09-11 DIAGNOSIS — E1122 Type 2 diabetes mellitus with diabetic chronic kidney disease: Secondary | ICD-10-CM | POA: Diagnosis not present

## 2017-09-11 DIAGNOSIS — E160 Drug-induced hypoglycemia without coma: Secondary | ICD-10-CM

## 2017-09-11 DIAGNOSIS — I6523 Occlusion and stenosis of bilateral carotid arteries: Secondary | ICD-10-CM | POA: Insufficient documentation

## 2017-09-11 DIAGNOSIS — E1121 Type 2 diabetes mellitus with diabetic nephropathy: Secondary | ICD-10-CM | POA: Diagnosis not present

## 2017-09-11 DIAGNOSIS — E11649 Type 2 diabetes mellitus with hypoglycemia without coma: Principal | ICD-10-CM | POA: Insufficient documentation

## 2017-09-11 DIAGNOSIS — T383X5A Adverse effect of insulin and oral hypoglycemic [antidiabetic] drugs, initial encounter: Secondary | ICD-10-CM | POA: Diagnosis not present

## 2017-09-11 DIAGNOSIS — N183 Chronic kidney disease, stage 3 (moderate): Secondary | ICD-10-CM | POA: Diagnosis not present

## 2017-09-11 DIAGNOSIS — E1136 Type 2 diabetes mellitus with diabetic cataract: Secondary | ICD-10-CM | POA: Insufficient documentation

## 2017-09-11 DIAGNOSIS — K219 Gastro-esophageal reflux disease without esophagitis: Secondary | ICD-10-CM | POA: Insufficient documentation

## 2017-09-11 DIAGNOSIS — N2581 Secondary hyperparathyroidism of renal origin: Secondary | ICD-10-CM | POA: Insufficient documentation

## 2017-09-11 DIAGNOSIS — I130 Hypertensive heart and chronic kidney disease with heart failure and stage 1 through stage 4 chronic kidney disease, or unspecified chronic kidney disease: Secondary | ICD-10-CM | POA: Insufficient documentation

## 2017-09-11 DIAGNOSIS — Z79899 Other long term (current) drug therapy: Secondary | ICD-10-CM | POA: Insufficient documentation

## 2017-09-11 DIAGNOSIS — Z794 Long term (current) use of insulin: Secondary | ICD-10-CM | POA: Insufficient documentation

## 2017-09-11 DIAGNOSIS — E785 Hyperlipidemia, unspecified: Secondary | ICD-10-CM | POA: Diagnosis not present

## 2017-09-11 DIAGNOSIS — I10 Essential (primary) hypertension: Secondary | ICD-10-CM | POA: Diagnosis present

## 2017-09-11 DIAGNOSIS — S0990XA Unspecified injury of head, initial encounter: Secondary | ICD-10-CM | POA: Diagnosis not present

## 2017-09-11 DIAGNOSIS — I509 Heart failure, unspecified: Secondary | ICD-10-CM | POA: Insufficient documentation

## 2017-09-11 DIAGNOSIS — Z881 Allergy status to other antibiotic agents status: Secondary | ICD-10-CM | POA: Insufficient documentation

## 2017-09-11 DIAGNOSIS — N189 Chronic kidney disease, unspecified: Secondary | ICD-10-CM

## 2017-09-11 DIAGNOSIS — E162 Hypoglycemia, unspecified: Secondary | ICD-10-CM | POA: Diagnosis present

## 2017-09-11 LAB — CBC WITH DIFFERENTIAL/PLATELET
BASOS ABS: 0 10*3/uL (ref 0.0–0.1)
Basophils Relative: 0 %
Eosinophils Absolute: 0.1 10*3/uL (ref 0.0–0.7)
Eosinophils Relative: 2 %
HCT: 42.9 % (ref 36.0–46.0)
HEMOGLOBIN: 13.2 g/dL (ref 12.0–15.0)
LYMPHS ABS: 2 10*3/uL (ref 0.7–4.0)
LYMPHS PCT: 37 %
MCH: 29.4 pg (ref 26.0–34.0)
MCHC: 30.8 g/dL (ref 30.0–36.0)
MCV: 95.5 fL (ref 78.0–100.0)
Monocytes Absolute: 0.5 10*3/uL (ref 0.1–1.0)
Monocytes Relative: 10 %
NEUTROS ABS: 2.8 10*3/uL (ref 1.7–7.7)
NEUTROS PCT: 51 %
Platelets: 238 10*3/uL (ref 150–400)
RBC: 4.49 MIL/uL (ref 3.87–5.11)
RDW: 14.1 % (ref 11.5–15.5)
WBC: 5.4 10*3/uL (ref 4.0–10.5)

## 2017-09-11 LAB — URINALYSIS, ROUTINE W REFLEX MICROSCOPIC
BILIRUBIN URINE: NEGATIVE
GLUCOSE, UA: 50 mg/dL — AB
KETONES UR: NEGATIVE mg/dL
LEUKOCYTES UA: NEGATIVE
Nitrite: NEGATIVE
PH: 8 (ref 5.0–8.0)
PROTEIN: NEGATIVE mg/dL
Specific Gravity, Urine: 1.004 — ABNORMAL LOW (ref 1.005–1.030)
WBC UA: NONE SEEN WBC/hpf (ref 0–5)

## 2017-09-11 LAB — BASIC METABOLIC PANEL
ANION GAP: 7 (ref 5–15)
BUN: 16 mg/dL (ref 6–20)
CHLORIDE: 103 mmol/L (ref 101–111)
CO2: 31 mmol/L (ref 22–32)
Calcium: 9.1 mg/dL (ref 8.9–10.3)
Creatinine, Ser: 1.52 mg/dL — ABNORMAL HIGH (ref 0.44–1.00)
GFR calc Af Amer: 39 mL/min — ABNORMAL LOW (ref >60)
GFR, EST NON AFRICAN AMERICAN: 33 mL/min — AB (ref >60)
GLUCOSE: 26 mg/dL — AB (ref 65–99)
Potassium: 3.6 mmol/L (ref 3.5–5.1)
Sodium: 141 mmol/L (ref 135–145)

## 2017-09-11 LAB — CBG MONITORING, ED
GLUCOSE-CAPILLARY: 126 mg/dL — AB (ref 65–99)
GLUCOSE-CAPILLARY: 150 mg/dL — AB (ref 65–99)
GLUCOSE-CAPILLARY: 198 mg/dL — AB (ref 65–99)
Glucose-Capillary: 36 mg/dL — CL (ref 65–99)
Glucose-Capillary: 81 mg/dL (ref 65–99)
Glucose-Capillary: 28 mg/dL — CL (ref 65–99)
Glucose-Capillary: 158 mg/dL — ABNORMAL HIGH (ref 65–99)
Glucose-Capillary: 181 mg/dL — ABNORMAL HIGH (ref 65–99)

## 2017-09-11 LAB — I-STAT CG4 LACTIC ACID, ED: Lactic Acid, Venous: 1.05 mmol/L (ref 0.5–1.9)

## 2017-09-11 LAB — ETHANOL: Alcohol, Ethyl (B): <10 mg/dL (ref <10)

## 2017-09-11 MED ORDER — IPRATROPIUM-ALBUTEROL 0.5-2.5 (3) MG/3ML IN SOLN: 3.0000 mL | Freq: Four times a day (QID) | RESPIRATORY_TRACT | Status: DC

## 2017-09-11 MED ORDER — INSULIN ASPART PROT & ASPART (70-30 MIX) 100 UNIT/ML PEN: 15.0000 [IU] | PEN_INJECTOR | Freq: Two times a day (BID) | SUBCUTANEOUS | Status: DC

## 2017-09-11 MED ORDER — DEXTROSE-NACL 5-0.45 % IV SOLN
INTRAVENOUS | Status: DC
  Administered 2017-09-11: 19:00:00 via INTRAVENOUS

## 2017-09-11 MED ORDER — LISINOPRIL 40 MG PO TABS
40.0000 mg | ORAL_TABLET | Freq: Every day | ORAL | Status: DC
  Administered 2017-09-12: 40 mg via ORAL
  Filled 2017-09-11: qty 1

## 2017-09-11 MED ORDER — DEXTROSE 50 % IV SOLN
INTRAVENOUS | Status: AC
  Administered 2017-09-11: 50 mL via INTRAVENOUS
  Filled 2017-09-11: qty 50

## 2017-09-11 MED ORDER — DEXTROSE 50 % IV SOLN: 1.0000 | Freq: Once | INTRAVENOUS | Status: DC

## 2017-09-11 MED ORDER — PRAVASTATIN SODIUM 40 MG PO TABS: 40.0000 mg | ORAL_TABLET | Freq: Every day | ORAL | Status: DC

## 2017-09-11 MED ORDER — INSULIN ASPART PROT & ASPART (70-30 MIX) 100 UNIT/ML ~~LOC~~ SUSP
15.0000 [IU] | Freq: Two times a day (BID) | SUBCUTANEOUS | Status: DC
  Filled 2017-09-11: qty 10

## 2017-09-11 MED ORDER — ALBUTEROL SULFATE (2.5 MG/3ML) 0.083% IN NEBU: 2.5000 mg | INHALATION_SOLUTION | RESPIRATORY_TRACT | Status: DC | PRN

## 2017-09-11 MED ORDER — ENOXAPARIN SODIUM 30 MG/0.3ML ~~LOC~~ SOLN
30.0000 mg | SUBCUTANEOUS | Status: DC
  Filled 2017-09-11: qty 0.3

## 2017-09-11 MED ORDER — DEXTROSE-NACL 5-0.45 % IV SOLN
INTRAVENOUS | Status: DC
  Administered 2017-09-11: 16:00:00 via INTRAVENOUS

## 2017-09-11 MED ORDER — DEXTROSE 50 % IV SOLN
INTRAVENOUS | Status: AC
  Filled 2017-09-11: qty 50

## 2017-09-11 MED ORDER — FAMOTIDINE 20 MG PO TABS: 20.0000 mg | ORAL_TABLET | Freq: Every day | ORAL | Status: DC

## 2017-09-11 MED ORDER — ISOSORBIDE MONONITRATE ER 60 MG PO TB24
60.0000 mg | ORAL_TABLET | Freq: Every morning | ORAL | Status: DC
  Administered 2017-09-12: 60 mg via ORAL
  Filled 2017-09-11: qty 1

## 2017-09-11 MED ORDER — DEXTROSE 50 % IV SOLN
1.0000 | Freq: Once | INTRAVENOUS | Status: AC
Start: 2017-09-11 — End: 2017-09-11
  Administered 2017-09-11 (×2): 50 mL via INTRAVENOUS

## 2017-09-11 MED ORDER — DILTIAZEM HCL ER COATED BEADS 180 MG PO CP24
300.0000 mg | ORAL_CAPSULE | Freq: Every day | ORAL | Status: DC
  Administered 2017-09-12: 300 mg via ORAL
  Filled 2017-09-11: qty 1

## 2017-09-11 MED ORDER — CARVEDILOL 12.5 MG PO TABS
12.5000 mg | ORAL_TABLET | Freq: Two times a day (BID) | ORAL | Status: DC
  Administered 2017-09-11 – 2017-09-12 (×2): 12.5 mg via ORAL
  Filled 2017-09-11 (×2): qty 1

## 2017-09-11 MED ORDER — ADULT MULTIVITAMIN W/MINERALS CH
1.0000 | ORAL_TABLET | Freq: Every day | ORAL | Status: DC
  Administered 2017-09-12: 1 via ORAL
  Filled 2017-09-11: qty 1

## 2017-09-11 MED ORDER — ACETAMINOPHEN 500 MG PO TABS: 1000.0000 mg | ORAL_TABLET | Freq: Four times a day (QID) | ORAL | Status: DC | PRN
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
## 2017-09-11 NOTE — ED Notes (Signed)
Delay in labs pt taken to CT and XRAY
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
## 2017-09-11 NOTE — ED Notes (Signed)
ED Provider at bedside. 

## 2017-09-11 NOTE — ED Notes (Signed)
Pt given [place] sandwich bag with apple sauce and water to drink at 14:55.

## 2017-09-11 NOTE — ED Notes (Signed)
CBG taken at 16:27 was 150.   Prior to this CBG being taken, Pt displayed signs of additional confusion (?) She stated she desperately had to use the restroom, but as soon as she was unhooked from the monitor, she ran and sat on the trash can, nearly falling over. Her gait and balance were unsteady prior to attempting to sit on the rim of the trash can.  Immediatly after Pt was finished and returned to sit on the bed, she stated that she had to go again. She ambulated to the bathroom, this time walking with a normal gait and normal balance. Pt used the restroom without assistance and walked back to her bed without assistance. She is back on the monitor. Nurse notified to restart IV. CBG was retaken after this was reported to PA and RN.

## 2017-09-11 NOTE — ED Notes (Addendum)
Pt was observed to be acting more confused (?) than previously. After being given her food, tech re-entered room to collect a UA left on the tray table. Pt was found holding the specimen bag and repeatedly asked "why" when tech explained she was collecting the urine. Initially wouldn't let go of her urine. Pt also initially seemed confused about being given lunch, as if she didn't understand she was allowed to eat or that the food was for her.  Pt also dc'd her own IV and was found attempting to eat her sandwich through the wrapper (by RN).

## 2017-09-11 NOTE — ED Notes (Signed)
Ordered food tray

## 2017-09-11 NOTE — ED Notes (Signed)
Patient eating dinner tray. 

## 2017-09-11 NOTE — ED Notes (Signed)
Hopsitalist at bedside.

## 2017-09-11 NOTE — ED Notes (Signed)
Pt pulled IV out new one placed.  Edp at beside.

## 2017-09-11 NOTE — ED Notes (Signed)
PLEASE NOTE: CBG taken at 13:17 was 36.

## 2017-09-11 NOTE — ED Notes (Signed)
Pt ambulatory to bathroom. Quick and steady gait.
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
## 2017-09-11 NOTE — ED Notes (Signed)
PLEASE NOTE: Pt's CBG at 15:11 is 28.

## 2017-09-12 ENCOUNTER — Encounter (HOSPITAL_COMMUNITY): Payer: Self-pay | Admitting: *Deleted

## 2017-09-12 ENCOUNTER — Other Ambulatory Visit: Payer: Self-pay

## 2017-09-12 DIAGNOSIS — E160 Drug-induced hypoglycemia without coma: Secondary | ICD-10-CM | POA: Diagnosis not present

## 2017-09-12 DIAGNOSIS — T383X5A Adverse effect of insulin and oral hypoglycemic [antidiabetic] drugs, initial encounter: Secondary | ICD-10-CM | POA: Diagnosis not present

## 2017-09-12 LAB — COMPREHENSIVE METABOLIC PANEL
ALBUMIN: 3.1 g/dL — AB (ref 3.5–5.0)
ALK PHOS: 77 U/L (ref 38–126)
ALT: 14 U/L (ref 14–54)
AST: 21 U/L (ref 15–41)
Anion gap: 9 (ref 5–15)
BILIRUBIN TOTAL: 0.7 mg/dL (ref 0.3–1.2)
BUN: 19 mg/dL (ref 6–20)
CO2: 29 mmol/L (ref 22–32)
CREATININE: 1.71 mg/dL — AB (ref 0.44–1.00)
Calcium: 8.7 mg/dL — ABNORMAL LOW (ref 8.9–10.3)
Chloride: 101 mmol/L (ref 101–111)
GFR calc Af Amer: 34 mL/min — ABNORMAL LOW (ref >60)
GFR calc non Af Amer: 29 mL/min — ABNORMAL LOW (ref >60)
GLUCOSE: 137 mg/dL — AB (ref 65–99)
Potassium: 4 mmol/L (ref 3.5–5.1)
Sodium: 139 mmol/L (ref 135–145)
TOTAL PROTEIN: 6.2 g/dL — AB (ref 6.5–8.1)

## 2017-09-12 LAB — GLUCOSE, CAPILLARY
GLUCOSE-CAPILLARY: 151 mg/dL — AB (ref 65–99)
Glucose-Capillary: 128 mg/dL — ABNORMAL HIGH (ref 65–99)
Glucose-Capillary: 155 mg/dL — ABNORMAL HIGH (ref 65–99)

## 2017-09-12 LAB — CBC
HEMATOCRIT: 39.4 % (ref 36.0–46.0)
Hemoglobin: 11.8 g/dL — ABNORMAL LOW (ref 12.0–15.0)
MCH: 28.6 pg (ref 26.0–34.0)
MCHC: 29.9 g/dL — AB (ref 30.0–36.0)
MCV: 95.4 fL (ref 78.0–100.0)
PLATELETS: 227 10*3/uL (ref 150–400)
RBC: 4.13 MIL/uL (ref 3.87–5.11)
RDW: 14.4 % (ref 11.5–15.5)
WBC: 6.4 10*3/uL (ref 4.0–10.5)

## 2017-09-12 MED ORDER — RANITIDINE HCL 150 MG PO TABS
150.0000 mg | ORAL_TABLET | Freq: Every day | ORAL | Status: DC
  Administered 2017-09-12 (×2): 150 mg via ORAL
  Filled 2017-09-12: qty 1

## 2017-09-12 MED ORDER — INSULIN ASPART PROT & ASPART (70-30 MIX) 100 UNIT/ML ~~LOC~~ SUSP: 8.0000 [IU] | Freq: Two times a day (BID) | SUBCUTANEOUS | 11 refills | Status: DC

## 2017-09-12 MED ORDER — INSULIN ASPART PROT & ASPART (70-30 MIX) 100 UNIT/ML ~~LOC~~ SUSP
8.0000 [IU] | Freq: Two times a day (BID) | SUBCUTANEOUS | Status: DC
  Filled 2017-09-12: qty 10

## 2017-09-12 MED ORDER — INSULIN ASPART PROT & ASPART (70-30 MIX) 100 UNIT/ML ~~LOC~~ SUSP
8.0000 [IU] | Freq: Two times a day (BID) | SUBCUTANEOUS | Status: DC
  Administered 2017-09-12: 8 [IU] via SUBCUTANEOUS
  Filled 2017-09-12: qty 10

## 2017-09-12 MED ORDER — RANITIDINE HCL 150 MG PO TABS
150.0000 mg | ORAL_TABLET | Freq: Two times a day (BID) | ORAL | Status: DC
  Filled 2017-09-12: qty 1

## 2017-09-12 NOTE — Care Management Obs Status (Signed)
MEDICARE OBSERVATION STATUS NOTIFICATION   Patient Details  Name: Tracy Webster MRN: [id number] Date of Birth: [birth year]   Medicare Observation Status Notification Given:  Yes    Lawerance Sabalebbie , RN 09/12/2017, 10:00 AM
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
## 2017-09-12 NOTE — Progress Notes (Signed)
Tracy BrasilShirley W Derk to be D/C'd Home per MD order.  Discussed prescriptions and follow up appointments with the patient. Prescriptions given to patient, medication list explained in detail. Pt verbalized understanding.  Allergies as of 09/12/2017      Reactions   Tetracyclines & Related       Medication List    STOP taking these medications   docusate sodium 100 MG capsule Commonly known as:  COLACE   insulin aspart protamine - aspart (70-30) 100 UNIT/ML FlexPen Commonly known as:  NOVOLOG MIX 70/30 FLEXPEN Replaced by:  insulin aspart protamine- aspart (70-30) 100 UNIT/ML injection     TAKE these medications   acetaminophen 500 MG tablet Commonly known as:  TYLENOL Take 1,000 mg by mouth every 6 (six) hours as needed for mild pain, moderate pain or fever.   albuterol (2.5 MG/3ML) 0.083% nebulizer solution Commonly known as:  PROVENTIL Take 3 mLs (2.5 mg total) by nebulization every 4 (four) hours as needed for wheezing or shortness of breath.   carvedilol 12.5 MG tablet Commonly known as:  COREG Take 12.5 mg by mouth 2 (two) times daily with a meal.   CENTRUM SILVER tablet Take 1 tablet by mouth daily.   diltiazem 300 MG 24 hr capsule Commonly known as:  CARDIZEM CD Take 300 mg by mouth daily.   fexofenadine 180 MG tablet Commonly known as:  ALLEGRA Take 180 mg by mouth daily as needed for allergies.   furosemide 20 MG tablet Commonly known as:  LASIX Take 20 mg by mouth daily.   insulin aspart protamine- aspart (70-30) 100 UNIT/ML injection Commonly known as:  NOVOLOG MIX 70/30 Inject 0.08 mLs (8 Units total) into the skin 2 (two) times daily with a meal. Replaces:  insulin aspart protamine - aspart (70-30) 100 UNIT/ML FlexPen   ipratropium-albuterol 0.5-2.5 (3) MG/3ML Soln Commonly known as:  DUONEB Take 3 mLs by nebulization every 6 (six) hours for 4 days.   isosorbide mononitrate 60 MG 24 hr tablet Commonly known as:  IMDUR Take 60 mg by mouth every morning.   lisinopril 40 MG tablet Commonly known as:  PRINIVIL,ZESTRIL Take 1 tablet by mouth daily.   lovastatin 20 MG tablet Commonly known as:  MEVACOR Take 20 mg by mouth every evening.   ranitidine 150 MG capsule Commonly known as:  ZANTAC Take 1 capsule (150 mg total) by mouth 2 (two) times daily.       Vitals:   09/12/17 0345 09/12/17 0756  BP: (!) 156/53 (!) 160/57  Pulse: (!) 54 60  Resp: 18 18  Temp: 98.3 F (36.8 C) 98.6 F (37 C)  SpO2: 93% 98%    Skin clean, dry and intact without evidence of skin break down, no evidence of skin tears noted. IV catheter discontinued intact. Site without signs and symptoms of complications. Dressing and pressure applied. Pt denies pain at this time. No complaints noted.  An After Visit Summary was printed and given to the patient. Patient escorted via WC, and D/C home via private auto.  Fara BorosSara  BSN, RN

## 2017-09-12 NOTE — ED Notes (Signed)
Report attempted 
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
## 2018-06-05 ENCOUNTER — Other Ambulatory Visit: Payer: Self-pay | Admitting: Family Medicine

## 2018-06-05 DIAGNOSIS — Z13820 Encounter for screening for osteoporosis: Secondary | ICD-10-CM

## 2018-06-05 DIAGNOSIS — Z1231 Encounter for screening mammogram for malignant neoplasm of breast: Secondary | ICD-10-CM
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
## 2021-02-18 ENCOUNTER — Encounter: Payer: Self-pay | Admitting: Obstetrics and Gynecology

## 2021-02-18 ENCOUNTER — Ambulatory Visit (INDEPENDENT_AMBULATORY_CARE_PROVIDER_SITE_OTHER): Payer: Medicare HMO | Admitting: Obstetrics and Gynecology

## 2021-02-18 ENCOUNTER — Other Ambulatory Visit: Payer: Self-pay

## 2021-02-18 VITALS — BP 120/65 | HR 60 | Ht 60.0 in | Wt 180.9 lb

## 2021-02-18 DIAGNOSIS — N814 Uterovaginal prolapse, unspecified: Secondary | ICD-10-CM | POA: Diagnosis not present

## 2021-02-18 DIAGNOSIS — Z7689 Persons encountering health services in other specified circumstances: Secondary | ICD-10-CM | POA: Diagnosis not present

## 2021-02-18 NOTE — Progress Notes (Signed)
HPI:      [name] is a 75 y.o. No obstetric history on file. who LMP was No LMP recorded. Patient is postmenopausal.  Subjective:   She presents today stating that she has been seen at [facility] for more than a year and they have given her a pessary.  She has been going approximately every 10 weeks for pessary care.  She states that she is not having any problems with her pessary.  It does not fall out.  She denies malodorous vaginal discharge.  She denies vaginal bleeding.  She is here today because she would like to change to a more local OB/GYN practice for her future pessary care.    Hx: The following portions of the patient's history were reviewed and updated as appropriate:             She  has a past medical history of CKD (chronic kidney disease), Diabetes mellitus without complication (HCC), GERD (gastroesophageal reflux disease), Hypertension, and Uterine prolapse. She does not have any pertinent problems on file. She  has no past surgical history on file. Her family history is not on file. She  reports that she has never smoked. She has never used smokeless tobacco. She reports that she does not drink alcohol. No history on file for drug use. She has a current medication list which includes the following prescription(s): acetaminophen, albuterol, carvedilol, diltiazem, fexofenadine, furosemide, insulin aspart protamine- aspart, ipratropium-albuterol, isosorbide mononitrate, lisinopril, lovastatin, centrum silver, and ranitidine. She is allergic to tetracyclines & related.       Review of Systems:  Review of Systems  Constitutional: Denied constitutional symptoms, night sweats, recent illness, fatigue, fever, insomnia and weight loss.  Eyes: Denied eye symptoms, eye pain, photophobia, vision change and visual disturbance.  Ears/Nose/Throat/Neck: Denied ear, nose, throat or neck symptoms, hearing loss, nasal discharge, sinus congestion and sore throat.  Cardiovascular: Denied  cardiovascular symptoms, arrhythmia, chest pain/pressure, edema, exercise intolerance, orthopnea and palpitations.  Respiratory: Denied pulmonary symptoms, asthma, pleuritic pain, productive sputum, cough, dyspnea and wheezing.  Gastrointestinal: Denied, gastro-esophageal reflux, melena, nausea and vomiting.  Genitourinary: Denied genitourinary symptoms including symptomatic vaginal discharge, pelvic relaxation issues, and urinary complaints.  Musculoskeletal: Denied musculoskeletal symptoms, stiffness, swelling, muscle weakness and myalgia.  Dermatologic: Denied dermatology symptoms, rash and scar.  Neurologic: Denied neurology symptoms, dizziness, headache, neck pain and syncope.  Psychiatric: Denied psychiatric symptoms, anxiety and depression.  Endocrine: Denied endocrine symptoms including hot flashes and night sweats.   Meds:   Current Outpatient Medications on File Prior to Visit  Medication Sig Dispense Refill   acetaminophen (TYLENOL) 500 MG tablet Take 1,000 mg by mouth every 6 (six) hours as needed for mild pain, moderate pain or fever.      albuterol (PROVENTIL) (2.5 MG/3ML) 0.083% nebulizer solution Take 3 mLs (2.5 mg total) by nebulization every 4 (four) hours as needed for wheezing or shortness of breath. (Patient not taking: No sig reported) 75 mL 12   carvedilol (COREG) 12.5 MG tablet Take 12.5 mg by mouth 2 (two) times daily with a meal.     diltiazem (CARDIZEM CD) 300 MG 24 hr capsule Take 300 mg by mouth daily.     fexofenadine (ALLEGRA) 180 MG tablet Take 180 mg by mouth daily as needed for allergies.      furosemide (LASIX) 20 MG tablet Take 20 mg by mouth daily.     insulin aspart protamine- aspart (NOVOLOG MIX 70/30) (70-30) 100 UNIT/ML injection Inject 0.08 mLs (8  Units total) into the skin 2 (two) times daily with a meal. 10 mL 11   ipratropium-albuterol (DUONEB) 0.5-2.5 (3) MG/3ML SOLN Take 3 mLs by nebulization every 6 (six) hours for 4 days. (Patient not taking:  Reported on 09/11/2017) 48 mL 0   isosorbide mononitrate (IMDUR) 60 MG 24 hr tablet Take 60 mg by mouth every morning.     lisinopril (PRINIVIL,ZESTRIL) 40 MG tablet Take 1 tablet by mouth daily.     lovastatin (MEVACOR) 20 MG tablet Take 20 mg by mouth every evening.      Multiple Vitamins-Minerals (CENTRUM SILVER) tablet Take 1 tablet by mouth daily.     ranitidine (ZANTAC) 150 MG capsule Take 1 capsule (150 mg total) by mouth 2 (two) times daily. 28 capsule 0   No current facility-administered medications on file prior to visit.          Objective:     Vitals:   02/18/21 0906  BP: 120/65  Pulse: 60   Filed Weights   02/18/21 0906  Weight: 180 lb 14.4 oz (82.1 kg)                Assessment:    No obstetric history on file. Patient Active Problem List   Diagnosis Date Noted   Hypoglycemia 09/11/2017   CKD (chronic kidney disease), stage III (HCC) 08/15/2017   Acute CHF (congestive heart failure) (HCC) 08/15/2017   Acute diastolic CHF (congestive heart failure) (HCC) 08/15/2017   Bronchospasm, acute    Insulin-requiring or dependent type II diabetes mellitus (HCC) 08/14/2017   Hypoglycemia due to insulin 08/14/2017   CHF, acute on chronic (HCC) 08/14/2017   CKD (chronic kidney disease), stage IV (HCC) 08/14/2017   Acute respiratory failure with hypoxia (HCC)      1. Encounter to establish care   2. Uterovaginal prolapse     Patient has a #4 ring pessary (obtained from [facility] records)   Plan:            1.  We have discussed pessaries in detail.  Patient had some confusion regarding pessary use maintenance and long-term.  2.  We have planned pessary care every 5 months for cleaning, vaginal inspection and reinsertion. Orders No orders of the defined types were placed in this encounter.   No orders of the defined types were placed in this encounter.     F/U  Return in about 2 months (around 04/20/2021). I spent 32 minutes involved in the care of this patient  preparing to see the patient by obtaining and reviewing her medical history (including labs, imaging tests and prior procedures), documenting clinical information in the electronic health record (EHR), counseling and coordinating care plans, writing and sending prescriptions, ordering tests or procedures and directly communicating with the patient by discussing pertinent items from her history and physical exam as well as detailing my assessment and plan as noted above so that she has an informed understanding.  All of her questions were answered.  Elonda Husky, M.D. 02/18/2021 10:00 AM

## 2021-04-15 ENCOUNTER — Ambulatory Visit: Payer: Medicare HMO | Admitting: Obstetrics and Gynecology

## 2021-04-15 ENCOUNTER — Other Ambulatory Visit: Payer: Self-pay

## 2021-04-15 ENCOUNTER — Encounter: Payer: Self-pay | Admitting: Obstetrics and Gynecology

## 2021-04-15 VITALS — BP 149/60 | HR 70 | Ht 60.0 in | Wt 179.9 lb

## 2021-04-15 DIAGNOSIS — Z4689 Encounter for fitting and adjustment of other specified devices: Secondary | ICD-10-CM

## 2021-04-15 NOTE — Progress Notes (Signed)
HPI:      Tracy Webster is a 75 y.o. No obstetric history on file. who LMP was No LMP recorded. Patient is postmenopausal.  Subjective:   She presents today for pessary maintenance/care.  She reports no problems from her pessary.  Reports no significant vaginal discharge or bleeding.    Hx: The following portions of the patient's history were reviewed and updated as appropriate:             She  has a past medical history of CKD (chronic kidney disease), Diabetes mellitus without complication (HCC), GERD (gastroesophageal reflux disease), Hypertension, and Uterine prolapse. She does not have any pertinent problems on file. She  has no past surgical history on file. Her family history is not on file. She  reports that she has never smoked. She has never used smokeless tobacco. She reports that she does not drink alcohol. No history on file for drug use. She has a current medication list which includes the following prescription(s): acetaminophen, fexofenadine, insulin aspart protamine- aspart, lisinopril, lovastatin, centrum silver, ranitidine, albuterol, carvedilol, diltiazem, furosemide, ipratropium-albuterol, and isosorbide mononitrate. She is allergic to tetracyclines & related.       Review of Systems:  Review of Systems  Constitutional: Denied constitutional symptoms, night sweats, recent illness, fatigue, fever, insomnia and weight loss.  Eyes: Denied eye symptoms, eye pain, photophobia, vision change and visual disturbance.  Ears/Nose/Throat/Neck: Denied ear, nose, throat or neck symptoms, hearing loss, nasal discharge, sinus congestion and sore throat.  Cardiovascular: Denied cardiovascular symptoms, arrhythmia, chest pain/pressure, edema, exercise intolerance, orthopnea and palpitations.  Respiratory: Denied pulmonary symptoms, asthma, pleuritic pain, productive sputum, cough, dyspnea and wheezing.  Gastrointestinal: Denied, gastro-esophageal reflux, melena, nausea and  vomiting.  Genitourinary: Denied genitourinary symptoms including symptomatic vaginal discharge, pelvic relaxation issues, and urinary complaints.  Musculoskeletal: Denied musculoskeletal symptoms, stiffness, swelling, muscle weakness and myalgia.  Dermatologic: Denied dermatology symptoms, rash and scar.  Neurologic: Denied neurology symptoms, dizziness, headache, neck pain and syncope.  Psychiatric: Denied psychiatric symptoms, anxiety and depression.  Endocrine: Denied endocrine symptoms including hot flashes and night sweats.   Meds:   Current Outpatient Medications on File Prior to Visit  Medication Sig Dispense Refill   acetaminophen (TYLENOL) 500 MG tablet Take 1,000 mg by mouth every 6 (six) hours as needed for mild pain, moderate pain or fever.      fexofenadine (ALLEGRA) 180 MG tablet Take 180 mg by mouth daily as needed for allergies.      insulin aspart protamine- aspart (NOVOLOG MIX 70/30) (70-30) 100 UNIT/ML injection Inject 0.08 mLs (8 Units total) into the skin 2 (two) times daily with a meal. 10 mL 11   lisinopril (PRINIVIL,ZESTRIL) 40 MG tablet Take 1 tablet by mouth daily.     lovastatin (MEVACOR) 20 MG tablet Take 20 mg by mouth every evening.      Multiple Vitamins-Minerals (CENTRUM SILVER) tablet Take 1 tablet by mouth daily.     ranitidine (ZANTAC) 150 MG capsule Take 1 capsule (150 mg total) by mouth 2 (two) times daily. 28 capsule 0   albuterol (PROVENTIL) (2.5 MG/3ML) 0.083% nebulizer solution Take 3 mLs (2.5 mg total) by nebulization every 4 (four) hours as needed for wheezing or shortness of breath. (Patient not taking: Reported on 04/15/2021) 75 mL 12   carvedilol (COREG) 12.5 MG tablet Take 12.5 mg by mouth 2 (two) times daily with a meal. (Patient not taking: Reported on 04/15/2021)     diltiazem (CARDIZEM CD) 300 MG 24  hr capsule Take 300 mg by mouth daily. (Patient not taking: Reported on 04/15/2021)     furosemide (LASIX) 20 MG tablet Take 20 mg by mouth daily.  (Patient not taking: Reported on 04/15/2021)     ipratropium-albuterol (DUONEB) 0.5-2.5 (3) MG/3ML SOLN Take 3 mLs by nebulization every 6 (six) hours for 4 days. (Patient not taking: Reported on 09/11/2017) 48 mL 0   isosorbide mononitrate (IMDUR) 60 MG 24 hr tablet Take 60 mg by mouth every morning. (Patient not taking: Reported on 04/15/2021)     No current facility-administered medications on file prior to visit.      Objective:     Vitals:   04/15/21 1238  BP: (!) 149/60  Pulse: 70   Filed Weights   04/15/21 1238  Weight: 179 lb 14.4 oz (81.6 kg)              Pessary Care Pessary removed and cleaned.  Vagina checked by speculum exam- without erosions - pessary replaced.   Assessment:    No obstetric history on file. Patient Active Problem List   Diagnosis Date Noted   Hypoglycemia 09/11/2017   CKD (chronic kidney disease), stage III (HCC) 08/15/2017   Acute CHF (congestive heart failure) (HCC) 08/15/2017   Acute diastolic CHF (congestive heart failure) (HCC) 08/15/2017   Bronchospasm, acute    Insulin-requiring or dependent type II diabetes mellitus (HCC) 08/14/2017   Hypoglycemia due to insulin 08/14/2017   CHF, acute on chronic (HCC) 08/14/2017   CKD (chronic kidney disease), stage IV (HCC) 08/14/2017   Acute respiratory failure with hypoxia (HCC)      1. Pessary maintenance     Patient doing well with current pessary.   Plan:            1.  Pessary replaced.  Follow-up for pessary care in 5 months. Orders No orders of the defined types were placed in this encounter.   No orders of the defined types were placed in this encounter.     F/U  Return in about 5 months (around 09/15/2021). I spent 21 minutes involved in the care of this patient preparing to see the patient by obtaining and reviewing her medical history (including labs, imaging tests and prior procedures), documenting clinical information in the electronic health record (EHR), counseling and  coordinating care plans, writing and sending prescriptions, ordering tests or procedures and in direct communicating with the patient and medical staff discussing pertinent items from her history and physical exam.  Tracy Webster, M.D. 04/15/2021 1:50 PM

## 2021-09-16 ENCOUNTER — Other Ambulatory Visit: Payer: Self-pay

## 2021-09-16 ENCOUNTER — Encounter: Payer: Self-pay | Admitting: Obstetrics and Gynecology

## 2021-09-16 ENCOUNTER — Ambulatory Visit: Payer: Medicare HMO | Admitting: Obstetrics and Gynecology

## 2021-09-16 VITALS — BP 134/47 | HR 65 | Resp 16 | Ht 60.0 in | Wt 159.0 lb

## 2021-09-16 DIAGNOSIS — Z4689 Encounter for fitting and adjustment of other specified devices: Secondary | ICD-10-CM | POA: Diagnosis not present

## 2021-09-16 NOTE — Progress Notes (Signed)
HPI:      [name] is a 76 y.o. No obstetric history on file. who LMP was No LMP recorded. Patient is postmenopausal.  Subjective:   She presents today for pessary maintenance.  She reports no bleeding or other problems.  She remains happy with the pessary.    Hx: The following portions of the patient's history were reviewed and updated as appropriate:             She  has a past medical history of CKD (chronic kidney disease), Diabetes mellitus without complication ([place]), GERD (gastroesophageal reflux disease), Hypertension, and Uterine prolapse. She does not have any pertinent problems on file. She  has no past surgical history on file. Her family history is not on file. She  reports that she has never smoked. She has never used smokeless tobacco. She reports that she does not drink alcohol. No history on file for drug use. She has a current medication list which includes the following prescription(s): acetaminophen, fexofenadine, insulin aspart protamine- aspart, lisinopril, lovastatin, centrum silver, ranitidine, albuterol, carvedilol, diltiazem, furosemide, ipratropium-albuterol, and isosorbide mononitrate. She is allergic to tetracyclines & related.       Review of Systems:  Review of Systems  Constitutional: Denied constitutional symptoms, night sweats, recent illness, fatigue, fever, insomnia and weight loss.  Eyes: Denied eye symptoms, eye pain, photophobia, vision change and visual disturbance.  Ears/Nose/Throat/Neck: Denied ear, nose, throat or neck symptoms, hearing loss, nasal discharge, sinus congestion and sore throat.  Cardiovascular: Denied cardiovascular symptoms, arrhythmia, chest pain/pressure, edema, exercise intolerance, orthopnea and palpitations.  Respiratory: Denied pulmonary symptoms, asthma, pleuritic pain, productive sputum, cough, dyspnea and wheezing.  Gastrointestinal: Denied, gastro-esophageal reflux, melena, nausea and vomiting.  Genitourinary:  Denied genitourinary symptoms including symptomatic vaginal discharge, pelvic relaxation issues, and urinary complaints.  Musculoskeletal: Denied musculoskeletal symptoms, stiffness, swelling, muscle weakness and myalgia.  Dermatologic: Denied dermatology symptoms, rash and scar.  Neurologic: Denied neurology symptoms, dizziness, headache, neck pain and syncope.  Psychiatric: Denied psychiatric symptoms, anxiety and depression.  Endocrine: Denied endocrine symptoms including hot flashes and night sweats.   Meds:   Current Outpatient Medications on File Prior to Visit  Medication Sig Dispense Refill   acetaminophen (TYLENOL) 500 MG tablet Take 1,000 mg by mouth every 6 (six) hours as needed for mild pain, moderate pain or fever.      fexofenadine (ALLEGRA) 180 MG tablet Take 180 mg by mouth daily as needed for allergies.      insulin aspart protamine- aspart (NOVOLOG MIX 70/30) (70-30) 100 UNIT/ML injection Inject 0.08 mLs (8 Units total) into the skin 2 (two) times daily with a meal. 10 mL 11   lisinopril (PRINIVIL,ZESTRIL) 40 MG tablet Take 1 tablet by mouth daily.     lovastatin (MEVACOR) 20 MG tablet Take 20 mg by mouth every evening.      Multiple Vitamins-Minerals (CENTRUM SILVER) tablet Take 1 tablet by mouth daily.     ranitidine (ZANTAC) 150 MG capsule Take 1 capsule (150 mg total) by mouth 2 (two) times daily. 28 capsule 0   albuterol (PROVENTIL) (2.5 MG/3ML) 0.083% nebulizer solution Take 3 mLs (2.5 mg total) by nebulization every 4 (four) hours as needed for wheezing or shortness of breath. (Patient not taking: Reported on 04/15/2021) 75 mL 12   carvedilol (COREG) 12.5 MG tablet Take 12.5 mg by mouth 2 (two) times daily with a meal. (Patient not taking: Reported on 04/15/2021)     diltiazem (CARDIZEM CD) 300 MG 24 hr  capsule Take 300 mg by mouth daily. (Patient not taking: Reported on 04/15/2021)     furosemide (LASIX) 20 MG tablet Take 20 mg by mouth daily. (Patient not taking: Reported  on 04/15/2021)     ipratropium-albuterol (DUONEB) 0.5-2.5 (3) MG/3ML SOLN Take 3 mLs by nebulization every 6 (six) hours for 4 days. (Patient not taking: Reported on 09/11/2017) 48 mL 0   isosorbide mononitrate (IMDUR) 60 MG 24 hr tablet Take 60 mg by mouth every morning. (Patient not taking: Reported on 04/15/2021)     No current facility-administered medications on file prior to visit.      Objective:     Vitals:   09/16/21 1243  BP: (!) 134/47  Pulse: 65  Resp: 16   Filed Weights   09/16/21 1243  Weight: 159 lb (72.1 kg)              Pessary Care Pessary removed -somewhat difficult removal.  Pessary cleaned.  Vagina checked by speculum exam- without erosions - pessary replaced.           Assessment:    No obstetric history on file. Patient Active Problem List   Diagnosis Date Noted   Hypoglycemia 09/11/2017   CKD (chronic kidney disease), stage III ([place]) 08/15/2017   Acute CHF (congestive heart failure) ([place]) 123456   Acute diastolic CHF (congestive heart failure) ([place]) 08/15/2017   Bronchospasm, acute    Insulin-requiring or dependent type II diabetes mellitus ([place]) 08/14/2017   Hypoglycemia due to insulin 08/14/2017   CHF, acute on chronic ([place]) 08/14/2017   CKD (chronic kidney disease), stage IV ([place]) 08/14/2017   Acute respiratory failure with hypoxia ([place])      1. Pessary maintenance        Plan:            1.  Follow-up in 5 months for pessary care.  As usual patient to inform us if she has any vaginal bleeding or significant vaginal discharge. Orders No orders of the defined types were placed in this encounter.   No orders of the defined types were placed in this encounter.     F/U  No follow-ups on file. I spent 23 minutes involved in the care of this patient preparing to see the patient by obtaining and reviewing her medical history (including labs, imaging tests and prior procedures), documenting clinical information in the electronic health  record (EHR), counseling and coordinating care plans, writing and sending prescriptions, ordering tests or procedures and in direct communicating with the patient and medical staff discussing pertinent items from her history and physical exam.  Finis Bud, M.D. 09/16/2021 1:12 PM

## 2022-02-17 ENCOUNTER — Ambulatory Visit: Payer: Medicare HMO | Admitting: Obstetrics and Gynecology

## 2022-02-17 ENCOUNTER — Encounter: Payer: Self-pay | Admitting: Obstetrics and Gynecology

## 2022-02-17 VITALS — BP 135/67 | HR 71 | Ht 60.0 in | Wt 181.6 lb

## 2022-02-17 DIAGNOSIS — Z4689 Encounter for fitting and adjustment of other specified devices: Secondary | ICD-10-CM

## 2022-02-17 NOTE — Progress Notes (Signed)
HPI:      [name] is a 76 y.o. G0P0000 who LMP was No LMP recorded. Patient is postmenopausal.  Subjective:   She presents today for pessary maintenance.  She reports she is doing well with her pessary.  She would like to continue its use.  Describes no problems.  No vaginal bleeding.    Hx: The following portions of the patient's history were reviewed and updated as appropriate:             She  has a past medical history of CKD (chronic kidney disease), Diabetes mellitus without complication (HCC), GERD (gastroesophageal reflux disease), Hypertension, and Uterine prolapse. She does not have any pertinent problems on file. She  has no past surgical history on file. Her family history is not on file. She  reports that she has never smoked. She has never used smokeless tobacco. She reports that she does not drink alcohol and does not use drugs. She has a current medication list which includes the following prescription(s): acetaminophen, albuterol, carvedilol, diltiazem, fexofenadine, furosemide, insulin aspart protamine- aspart, isosorbide mononitrate, lisinopril, lovastatin, centrum silver, ranitidine, and ipratropium-albuterol. She is allergic to tetracyclines & related.       Review of Systems:  Review of Systems  Constitutional: Denied constitutional symptoms, night sweats, recent illness, fatigue, fever, insomnia and weight loss.  Eyes: Denied eye symptoms, eye pain, photophobia, vision change and visual disturbance.  Ears/Nose/Throat/Neck: Denied ear, nose, throat or neck symptoms, hearing loss, nasal discharge, sinus congestion and sore throat.  Cardiovascular: Denied cardiovascular symptoms, arrhythmia, chest pain/pressure, edema, exercise intolerance, orthopnea and palpitations.  Respiratory: Denied pulmonary symptoms, asthma, pleuritic pain, productive sputum, cough, dyspnea and wheezing.  Gastrointestinal: Denied, gastro-esophageal reflux, melena, nausea and vomiting.   Genitourinary: Denied genitourinary symptoms including symptomatic vaginal discharge, pelvic relaxation issues, and urinary complaints.  Musculoskeletal: Denied musculoskeletal symptoms, stiffness, swelling, muscle weakness and myalgia.  Dermatologic: Denied dermatology symptoms, rash and scar.  Neurologic: Denied neurology symptoms, dizziness, headache, neck pain and syncope.  Psychiatric: Denied psychiatric symptoms, anxiety and depression.  Endocrine: Denied endocrine symptoms including hot flashes and night sweats.   Meds:   Current Outpatient Medications on File Prior to Visit  Medication Sig Dispense Refill   acetaminophen (TYLENOL) 500 MG tablet Take 1,000 mg by mouth every 6 (six) hours as needed for mild pain, moderate pain or fever.      albuterol (PROVENTIL) (2.5 MG/3ML) 0.083% nebulizer solution Take 3 mLs (2.5 mg total) by nebulization every 4 (four) hours as needed for wheezing or shortness of breath. 75 mL 12   carvedilol (COREG) 12.5 MG tablet Take 12.5 mg by mouth 2 (two) times daily with a meal.     diltiazem (CARDIZEM CD) 300 MG 24 hr capsule Take 300 mg by mouth daily.     fexofenadine (ALLEGRA) 180 MG tablet Take 180 mg by mouth daily as needed for allergies.      furosemide (LASIX) 20 MG tablet Take 20 mg by mouth daily.     insulin aspart protamine- aspart (NOVOLOG MIX 70/30) (70-30) 100 UNIT/ML injection Inject 0.08 mLs (8 Units total) into the skin 2 (two) times daily with a meal. 10 mL 11   isosorbide mononitrate (IMDUR) 60 MG 24 hr tablet Take 60 mg by mouth every morning.     lisinopril (PRINIVIL,ZESTRIL) 40 MG tablet Take 1 tablet by mouth daily.     lovastatin (MEVACOR) 20 MG tablet Take 20 mg by mouth every evening.  Multiple Vitamins-Minerals (CENTRUM SILVER) tablet Take 1 tablet by mouth daily.     ranitidine (ZANTAC) 150 MG capsule Take 1 capsule (150 mg total) by mouth 2 (two) times daily. 28 capsule 0   ipratropium-albuterol (DUONEB) 0.5-2.5 (3) MG/3ML  SOLN Take 3 mLs by nebulization every 6 (six) hours for 4 days. (Patient not taking: Reported on 09/11/2017) 48 mL 0   No current facility-administered medications on file prior to visit.      Objective:     Vitals:   02/17/22 1307  BP: 135/67  Pulse: 71   Filed Weights   02/17/22 1307  Weight: 181 lb 9.6 oz (82.4 kg)              Pessary Care Pessary removed and cleaned.  Vagina checked by speculum exam- without erosions - pessary replaced.           Assessment:    G0P0000 Patient Active Problem List   Diagnosis Date Noted   Hypoglycemia 09/11/2017   CKD (chronic kidney disease), stage III (HCC) 08/15/2017   Acute CHF (congestive heart failure) (HCC) 08/15/2017   Acute diastolic CHF (congestive heart failure) (HCC) 08/15/2017   Bronchospasm, acute    Insulin-requiring or dependent type II diabetes mellitus (HCC) 08/14/2017   Hypoglycemia due to insulin 08/14/2017   CHF, acute on chronic (HCC) 08/14/2017   CKD (chronic kidney disease), stage IV (HCC) 08/14/2017   Acute respiratory failure with hypoxia (HCC)      1. Pessary maintenance        Plan:            1.  Patient to return in 6 months for pessary care. Orders No orders of the defined types were placed in this encounter.   No orders of the defined types were placed in this encounter.     F/U  Return in about 6 months (around 08/19/2022).  Elonda Husky, M.D. 02/17/2022 1:34 PM

## 2022-02-17 NOTE — Progress Notes (Signed)
Patient presents today for pessary maintenance. She states no issues, pain, bleeding or discharge. Patient states no other questions or concerns.

## 2022-06-08 ENCOUNTER — Telehealth: Payer: Self-pay

## 2022-06-08 NOTE — Telephone Encounter (Signed)
Pt called triage needing to know when her next appointment was with Dr Amalia Hailey, Pt aware it 12/15

## 2022-08-17 ENCOUNTER — Encounter: Payer: Self-pay | Admitting: Obstetrics and Gynecology

## 2022-08-18 ENCOUNTER — Ambulatory Visit: Payer: Medicare HMO | Admitting: Obstetrics and Gynecology

## 2022-08-18 ENCOUNTER — Encounter: Payer: Self-pay | Admitting: Obstetrics and Gynecology

## 2022-08-18 VITALS — BP 107/60 | HR 62 | Ht 60.0 in | Wt 180.3 lb

## 2022-08-18 DIAGNOSIS — Z4689 Encounter for fitting and adjustment of other specified devices: Secondary | ICD-10-CM

## 2022-08-18 NOTE — Progress Notes (Signed)
HPI:      Ms. FAYETH HUCKABAY is a 76 y.o. G0P0000 who LMP was No LMP recorded. Patient is postmenopausal.  Subjective:   She presents today for 6 months pessary care.   She reports no problems.  Reports no vaginal bleeding.  She is happy with the pessary.    Hx: The following portions of the patient's history were reviewed and updated as appropriate:             She  has a past medical history of CKD (chronic kidney disease), Diabetes mellitus without complication ([place]), GERD (gastroesophageal reflux disease), Hypertension, and Uterine prolapse. She does not have any pertinent problems on file. She  has no past surgical history on file. Her family history is not on file. She  reports that she has never smoked. She has never used smokeless tobacco. She reports that she does not drink alcohol and does not use drugs. She has a current medication list which includes the following prescription(s): acetaminophen, albuterol, carvedilol, diltiazem, fexofenadine, furosemide, insulin aspart protamine- aspart, ipratropium-albuterol, isosorbide mononitrate, lisinopril, lovastatin, centrum silver, and ranitidine. She is allergic to tetracyclines & related.       Review of Systems:  Review of Systems  Constitutional: Denied constitutional symptoms, night sweats, recent illness, fatigue, fever, insomnia and weight loss.  Eyes: Denied eye symptoms, eye pain, photophobia, vision change and visual disturbance.  Ears/Nose/Throat/Neck: Denied ear, nose, throat or neck symptoms, hearing loss, nasal discharge, sinus congestion and sore throat.  Cardiovascular: Denied cardiovascular symptoms, arrhythmia, chest pain/pressure, edema, exercise intolerance, orthopnea and palpitations.  Respiratory: Denied pulmonary symptoms, asthma, pleuritic pain, productive sputum, cough, dyspnea and wheezing.  Gastrointestinal: Denied, gastro-esophageal reflux, melena, nausea and vomiting.  Genitourinary: Denied genitourinary  symptoms including symptomatic vaginal discharge, pelvic relaxation issues, and urinary complaints.  Musculoskeletal: Denied musculoskeletal symptoms, stiffness, swelling, muscle weakness and myalgia.  Dermatologic: Denied dermatology symptoms, rash and scar.  Neurologic: Denied neurology symptoms, dizziness, headache, neck pain and syncope.  Psychiatric: Denied psychiatric symptoms, anxiety and depression.  Endocrine: Denied endocrine symptoms including hot flashes and night sweats.   Meds:   Current Outpatient Medications on File Prior to Visit  Medication Sig Dispense Refill   acetaminophen (TYLENOL) 500 MG tablet Take 1,000 mg by mouth every 6 (six) hours as needed for mild pain, moderate pain or fever.      albuterol (PROVENTIL) (2.5 MG/3ML) 0.083% nebulizer solution Take 3 mLs (2.5 mg total) by nebulization every 4 (four) hours as needed for wheezing or shortness of breath. 75 mL 12   carvedilol (COREG) 12.5 MG tablet Take 12.5 mg by mouth 2 (two) times daily with a meal.     diltiazem (CARDIZEM CD) 300 MG 24 hr capsule Take 300 mg by mouth daily.     fexofenadine (ALLEGRA) 180 MG tablet Take 180 mg by mouth daily as needed for allergies.      furosemide (LASIX) 20 MG tablet Take 20 mg by mouth daily.     insulin aspart protamine- aspart (NOVOLOG MIX 70/30) (70-30) 100 UNIT/ML injection Inject 0.08 mLs (8 Units total) into the skin 2 (two) times daily with a meal. 10 mL 11   ipratropium-albuterol (DUONEB) 0.5-2.5 (3) MG/3ML SOLN Take 3 mLs by nebulization every 6 (six) hours for 4 days. (Patient not taking: Reported on 09/11/2017) 48 mL 0   isosorbide mononitrate (IMDUR) 60 MG 24 hr tablet Take 60 mg by mouth every morning.     lisinopril (PRINIVIL,ZESTRIL) 40 MG tablet Take 1 tablet  by mouth daily.     lovastatin (MEVACOR) 20 MG tablet Take 20 mg by mouth every evening.     Multiple Vitamins-Minerals (CENTRUM SILVER) tablet Take 1 tablet by mouth daily.     ranitidine (ZANTAC) 150 MG  capsule Take 1 capsule (150 mg total) by mouth 2 (two) times daily. 28 capsule 0   No current facility-administered medications on file prior to visit.      Objective:     Vitals:   08/18/22 1414  BP: 107/60  Pulse: 62   Filed Weights   08/18/22 1414  Weight: 180 lb 4.8 oz (81.8 kg)              Pessary Care Pessary removed and cleaned.  Vagina checked by speculum exam- without erosions - pessary replaced.           Assessment:    G0P0000 Patient Active Problem List   Diagnosis Date Noted   Hypoglycemia 09/11/2017   CKD (chronic kidney disease), stage III (HCC) 08/15/2017   Acute CHF (congestive heart failure) (HCC) 08/15/2017   Acute diastolic CHF (congestive heart failure) (HCC) 08/15/2017   Bronchospasm, acute    Insulin-requiring or dependent type II diabetes mellitus (HCC) 08/14/2017   Hypoglycemia due to insulin 08/14/2017   CHF, acute on chronic (HCC) 08/14/2017   CKD (chronic kidney disease), stage IV (HCC) 08/14/2017   Acute respiratory failure with hypoxia (HCC)      1. Pessary maintenance        Plan:            1.  Follow-up in 6 months for further pessary care. Orders No orders of the defined types were placed in this encounter.   No orders of the defined types were placed in this encounter.     F/U  Return in about 6 months (around 02/17/2023). I spent 18 minutes involved in the care of this patient preparing to see the patient by obtaining and reviewing her medical history (including labs, imaging tests and prior procedures), documenting clinical information in the electronic health record (EHR), counseling and coordinating care plans, writing and sending prescriptions, ordering tests or procedures and in direct communicating with the patient and medical staff discussing pertinent items from her history and physical exam.  Elonda Husky, M.D. 08/18/2022 2:57 PM

## 2022-08-18 NOTE — Progress Notes (Signed)
Patient presents for pessary maintenance. She states no issues or concerns over the last 6 months. Patient states no other questions or concerns at this time.

## 2022-08-19 ENCOUNTER — Ambulatory Visit: Payer: Medicare HMO | Admitting: Obstetrics and Gynecology

## 2023-02-15 ENCOUNTER — Ambulatory Visit: Payer: Medicare HMO | Admitting: Obstetrics and Gynecology

## 2023-02-15 DIAGNOSIS — Z4689 Encounter for fitting and adjustment of other specified devices: Secondary | ICD-10-CM

## 2023-03-15 ENCOUNTER — Ambulatory Visit: Payer: Medicare HMO | Admitting: Obstetrics and Gynecology

## 2023-03-28 ENCOUNTER — Encounter: Payer: Self-pay | Admitting: Obstetrics and Gynecology

## 2023-03-28 ENCOUNTER — Ambulatory Visit (INDEPENDENT_AMBULATORY_CARE_PROVIDER_SITE_OTHER): Payer: Medicare HMO | Admitting: Obstetrics and Gynecology

## 2023-03-28 VITALS — BP 128/64 | HR 62 | Ht 60.0 in | Wt 178.8 lb

## 2023-03-28 DIAGNOSIS — Z4689 Encounter for fitting and adjustment of other specified devices: Secondary | ICD-10-CM

## 2023-03-28 NOTE — Progress Notes (Signed)
Patient presents for pessary maintenance. She states no problems or concerns at this time.

## 2023-03-28 NOTE — Progress Notes (Signed)
HPI:      [name] is a 77 y.o. G0P0000 who LMP was No LMP recorded. Patient is postmenopausal.  Subjective:   She presents today for routine pessary care.  She reports she is not having any problems with her pessary.  She says that she does not have any vaginal bleeding or significant discharge.  She says the months "got away from her" and she is late for her normal appointment.    Hx: The following portions of the patient's history were reviewed and updated as appropriate:             She  has a past medical history of CKD (chronic kidney disease), Diabetes mellitus without complication (HCC), GERD (gastroesophageal reflux disease), Hypertension, and Uterine prolapse. She does not have any pertinent problems on file. She  has no past surgical history on file. Her family history is not on file. She  reports that she has never smoked. She has never used smokeless tobacco. She reports that she does not drink alcohol and does not use drugs. She has a current medication list which includes the following prescription(s): acetaminophen, albuterol, carvedilol, diltiazem, fexofenadine, furosemide, insulin aspart protamine- aspart, isosorbide mononitrate, lisinopril, lovastatin, centrum silver, ranitidine, and ipratropium-albuterol. She is allergic to tetracyclines & related.       Review of Systems:  Review of Systems  Constitutional: Denied constitutional symptoms, night sweats, recent illness, fatigue, fever, insomnia and weight loss.  Eyes: Denied eye symptoms, eye pain, photophobia, vision change and visual disturbance.  Ears/Nose/Throat/Neck: Denied ear, nose, throat or neck symptoms, hearing loss, nasal discharge, sinus congestion and sore throat.  Cardiovascular: Denied cardiovascular symptoms, arrhythmia, chest pain/pressure, edema, exercise intolerance, orthopnea and palpitations.  Respiratory: Denied pulmonary symptoms, asthma, pleuritic pain, productive sputum, cough, dyspnea  and wheezing.  Gastrointestinal: Denied, gastro-esophageal reflux, melena, nausea and vomiting.  Genitourinary: Denied genitourinary symptoms including symptomatic vaginal discharge, pelvic relaxation issues, and urinary complaints.  Musculoskeletal: Denied musculoskeletal symptoms, stiffness, swelling, muscle weakness and myalgia.  Dermatologic: Denied dermatology symptoms, rash and scar.  Neurologic: Denied neurology symptoms, dizziness, headache, neck pain and syncope.  Psychiatric: Denied psychiatric symptoms, anxiety and depression.  Endocrine: Denied endocrine symptoms including hot flashes and night sweats.   Meds:   Current Outpatient Medications on File Prior to Visit  Medication Sig Dispense Refill   acetaminophen (TYLENOL) 500 MG tablet Take 1,000 mg by mouth every 6 (six) hours as needed for mild pain, moderate pain or fever.      albuterol (PROVENTIL) (2.5 MG/3ML) 0.083% nebulizer solution Take 3 mLs (2.5 mg total) by nebulization every 4 (four) hours as needed for wheezing or shortness of breath. 75 mL 12   carvedilol (COREG) 12.5 MG tablet Take 12.5 mg by mouth 2 (two) times daily with a meal.     diltiazem (CARDIZEM CD) 300 MG 24 hr capsule Take 300 mg by mouth daily.     fexofenadine (ALLEGRA) 180 MG tablet Take 180 mg by mouth daily as needed for allergies.      furosemide (LASIX) 20 MG tablet Take 20 mg by mouth daily.     insulin aspart protamine- aspart (NOVOLOG MIX 70/30) (70-30) 100 UNIT/ML injection Inject 0.08 mLs (8 Units total) into the skin 2 (two) times daily with a meal. 10 mL 11   isosorbide mononitrate (IMDUR) 60 MG 24 hr tablet Take 60 mg by mouth every morning.     lisinopril (PRINIVIL,ZESTRIL) 40 MG tablet Take 1 tablet by mouth daily.  lovastatin (MEVACOR) 20 MG tablet Take 20 mg by mouth every evening.     Multiple Vitamins-Minerals (CENTRUM SILVER) tablet Take 1 tablet by mouth daily.     ranitidine (ZANTAC) 150 MG capsule Take 1 capsule (150 mg total)  by mouth 2 (two) times daily. 28 capsule 0   ipratropium-albuterol (DUONEB) 0.5-2.5 (3) MG/3ML SOLN Take 3 mLs by nebulization every 6 (six) hours for 4 days. (Patient not taking: Reported on 09/11/2017) 48 mL 0   No current facility-administered medications on file prior to visit.      Objective:     Vitals:   03/28/23 1446  BP: 128/64  Pulse: 62   Filed Weights   03/28/23 1446  Weight: 178 lb 12.8 oz (81.1 kg)              Pessary Care Pessary removed and cleaned.  Vagina checked by speculum exam- without erosions - pessary replaced.  Some acute bleeding caused by difficult pessary removal           Assessment:    G0P0000 Patient Active Problem List   Diagnosis Date Noted   Hypoglycemia 09/11/2017   CKD (chronic kidney disease), stage III (HCC) 08/15/2017   Acute CHF (congestive heart failure) (HCC) 08/15/2017   Acute diastolic CHF (congestive heart failure) (HCC) 08/15/2017   Bronchospasm, acute    Insulin-requiring or dependent type II diabetes mellitus (HCC) 08/14/2017   Hypoglycemia due to insulin 08/14/2017   CHF, acute on chronic (HCC) 08/14/2017   CKD (chronic kidney disease), stage IV (HCC) 08/14/2017   Acute respiratory failure with hypoxia (HCC)      1. Pessary maintenance        Plan:            1.  Plan follow-up in 5 months at this time. Orders No orders of the defined types were placed in this encounter.   No orders of the defined types were placed in this encounter.     F/U  Return in about 5 months (around 08/28/2023). I spent 18 minutes involved in the care of this patient preparing to see the patient by obtaining and reviewing her medical history (including labs, imaging tests and prior procedures), documenting clinical information in the electronic health record (EHR), counseling and coordinating care plans, writing and sending prescriptions, ordering tests or procedures and in direct communicating with the patient and medical staff  discussing pertinent items from her history and physical exam.  Elonda Husky, M.D. 03/28/2023 3:25 PM

## 2023-04-11 ENCOUNTER — Other Ambulatory Visit: Payer: Self-pay

## 2023-04-11 ENCOUNTER — Emergency Department (HOSPITAL_COMMUNITY)
Admission: EM | Admit: 2023-04-11 | Discharge: 2023-04-11 | Disposition: A | Discharge status: Home / Self Care | Payer: Medicare HMO | Attending: Student | Admitting: Student

## 2023-04-11 ENCOUNTER — Encounter (HOSPITAL_COMMUNITY): Payer: Self-pay | Admitting: Emergency Medicine

## 2023-04-11 ENCOUNTER — Emergency Department (HOSPITAL_COMMUNITY): Payer: Medicare HMO

## 2023-04-11 DIAGNOSIS — N184 Chronic kidney disease, stage 4 (severe): Secondary | ICD-10-CM | POA: Insufficient documentation

## 2023-04-11 DIAGNOSIS — I130 Hypertensive heart and chronic kidney disease with heart failure and stage 1 through stage 4 chronic kidney disease, or unspecified chronic kidney disease: Secondary | ICD-10-CM | POA: Insufficient documentation

## 2023-04-11 DIAGNOSIS — E1122 Type 2 diabetes mellitus with diabetic chronic kidney disease: Secondary | ICD-10-CM | POA: Diagnosis not present

## 2023-04-11 DIAGNOSIS — Z79899 Other long term (current) drug therapy: Secondary | ICD-10-CM | POA: Insufficient documentation

## 2023-04-11 DIAGNOSIS — I5031 Acute diastolic (congestive) heart failure: Secondary | ICD-10-CM | POA: Insufficient documentation

## 2023-04-11 DIAGNOSIS — Z794 Long term (current) use of insulin: Secondary | ICD-10-CM | POA: Insufficient documentation

## 2023-04-11 DIAGNOSIS — M25572 Pain in left ankle and joints of left foot: Secondary | ICD-10-CM | POA: Insufficient documentation

## 2023-04-11 MED ORDER — NAPROXEN 375 MG PO TABS: 375.0000 mg | ORAL_TABLET | Freq: Two times a day (BID) | ORAL | 0 refills | Status: DC

## 2023-04-11 MED ORDER — HYDROCODONE-ACETAMINOPHEN 5-325 MG PO TABS
1.0000 | ORAL_TABLET | Freq: Once | ORAL | Status: AC
  Administered 2023-04-11: 1 via ORAL
  Filled 2023-04-11: qty 1

## 2023-04-11 NOTE — ED Provider Notes (Signed)
[place] EMERGENCY DEPARTMENT AT [facility] Provider Note  CSN: [id number] Arrival date & time: 04/11/23 1207  Chief Complaint(s) Ankle Pain  HPI Tracy Webster is a 77 y.o. female with PMH CKD, T2DM, GERD, HTN who presents emergency department for evaluation of left ankle pain.  Patient was getting out of bed and rolled her left ankle.  She fell on the ground and was unable to get up due to the pain.  She denies chest pain, shortness of breath, Donnell pain, nausea, vomiting or other systemic or traumatic complaints.  No head strike or blood thinner use.  Patient arrives with swelling of the left ankle but is neurovascularly intact.   Past Medical History Past Medical History:  Diagnosis Date   CKD (chronic kidney disease)    Diabetes mellitus without complication (HCC)    GERD (gastroesophageal reflux disease)    Hypertension    Uterine prolapse    Patient Active Problem List   Diagnosis Date Noted   Hypoglycemia 09/11/2017   CKD (chronic kidney disease), stage III (HCC) 08/15/2017   Acute CHF (congestive heart failure) (HCC) 08/15/2017   Acute diastolic CHF (congestive heart failure) (HCC) 08/15/2017   Bronchospasm, acute    Insulin-requiring or dependent type II diabetes mellitus (HCC) 08/14/2017   Hypoglycemia due to insulin 08/14/2017   CHF, acute on chronic (HCC) 08/14/2017   CKD (chronic kidney disease), stage IV (HCC) 08/14/2017   Acute respiratory failure with hypoxia (HCC)    Home Medication(s) Prior to Admission medications   Medication Sig Start Date End Date Taking? Authorizing Provider  naproxen (NAPROSYN) 375 MG tablet Take 1 tablet (375 mg total) by mouth 2 (two) times daily. 04/11/23  Yes , , MD  acetaminophen (TYLENOL) 500 MG tablet Take 1,000 mg by mouth every 6 (six) hours as needed for mild pain, moderate pain or fever.     [provider]  albuterol (PROVENTIL) (2.5 MG/3ML) 0.083% nebulizer solution Take 3 mLs (2.5 mg  total) by nebulization every 4 (four) hours as needed for wheezing or shortness of breath. 08/17/17   Langston Reusing, MD  carvedilol (COREG) 12.5 MG tablet Take 12.5 mg by mouth 2 (two) times daily with a meal.    [provider]  diltiazem (CARDIZEM CD) 300 MG 24 hr capsule Take 300 mg by mouth daily.    [provider]  fexofenadine (ALLEGRA) 180 MG tablet Take 180 mg by mouth daily as needed for allergies.     [provider]  furosemide (LASIX) 20 MG tablet Take 20 mg by mouth daily. 07/10/17   [provider]  insulin aspart protamine- aspart (NOVOLOG MIX 70/30) (70-30) 100 UNIT/ML injection Inject 0.08 mLs (8 Units total) into the skin 2 (two) times daily with a meal. 09/12/17   Rhetta Mura, MD  ipratropium-albuterol (DUONEB) 0.5-2.5 (3) MG/3ML SOLN Take 3 mLs by nebulization every 6 (six) hours for 4 days. Patient not taking: Reported on 09/11/2017 08/17/17 08/21/17  Langston Reusing, MD  isosorbide mononitrate (IMDUR) 60 MG 24 hr tablet Take 60 mg by mouth every morning.    [provider]  lisinopril (PRINIVIL,ZESTRIL) 40 MG tablet Take 1 tablet by mouth daily. 09/22/15   [provider]  lovastatin (MEVACOR) 20 MG tablet Take 20 mg by mouth every evening.    [provider]  Multiple Vitamins-Minerals (CENTRUM SILVER) tablet Take 1 tablet by mouth daily.    [provider]  ranitidine (ZANTAC) 150 MG capsule Take 1  capsule (150 mg total) by mouth 2 (two) times daily. 02/15/16   Sharman Cheek, MD                                                                                                                                    Past Surgical History History reviewed. No pertinent surgical history. Family History History reviewed. No pertinent family history.  Social History Social History   Tobacco Use   Smoking status: Never   Smokeless tobacco: Never  Substance Use Topics   Alcohol use: No    Drug use: Never   Allergies Tetracyclines & related  Review of Systems Review of Systems  Musculoskeletal:  Positive for arthralgias and joint swelling.    Physical Exam Vital Signs  I have reviewed the triage vital signs BP (!) 131/52 (BP Location: Right Arm)   Pulse (!) 56   Temp 97.9 F (36.6 C) (Oral)   Resp 14   Ht 5' (1.524 m)   Wt 81.1 kg   SpO2 95%   BMI 34.92 kg/m   Physical Exam Vitals and nursing note reviewed.  Constitutional:      General: She is not in acute distress.    Appearance: She is well-developed.  HENT:     Head: Normocephalic and atraumatic.  Eyes:     Conjunctiva/sclera: Conjunctivae normal.  Cardiovascular:     Rate and Rhythm: Normal rate and regular rhythm.     Heart sounds: No murmur heard. Pulmonary:     Effort: Pulmonary effort is normal. No respiratory distress.     Breath sounds: Normal breath sounds.  Abdominal:     Palpations: Abdomen is soft.     Tenderness: There is no abdominal tenderness.  Musculoskeletal:        General: Swelling and tenderness present.     Cervical back: Neck supple.  Skin:    General: Skin is warm and dry.     Capillary Refill: Capillary refill takes less than 2 seconds.  Neurological:     Mental Status: She is alert.  Psychiatric:        Mood and Affect: Mood normal.     ED Results and Treatments Labs (all labs ordered are listed, but only abnormal results are displayed) Labs Reviewed - No data to display  Radiology DG Ankle 2 Views Left  Result Date: 04/11/2023 CLINICAL DATA:  Trauma, fall EXAM: LEFT ANKLE - 2 VIEW COMPARISON:  None Available. FINDINGS: No displaced fracture or dislocation is seen. In the lateral view, there is possible break in the posterior cortical margin in the distal shaft of fibula. This finding is difficult to localize on the AP view. There is marked soft  tissue swelling around the ankle and along the dorsal aspect of the foot. Small plantar spur is seen in calcaneus. Calcification at the attachment of Achilles tendon suggests calcific tendinosis. IMPRESSION: There is cortical irregularity in the posterior margin of the distal shaft of fibula seen only in the lateral view. Findings suggest possible undisplaced fracture. Please correlate with physical examination findings. Electronically Signed   By: Ernie Avena M.D.   On: 04/11/2023 13:22    Pertinent labs & imaging results that were available during my care of the patient were reviewed by me and considered in my medical decision making (see MDM for details).  Medications Ordered in ED Medications  HYDROcodone-acetaminophen (NORCO/VICODIN) 5-325 MG per tablet 1 tablet (1 tablet Oral Given 04/11/23 1334)                                                                                                                                     Procedures .Ortho Injury Treatment  Date/Time: 04/11/2023 9:54 PM  Performed by: Glendora Score, MD Authorized by: Glendora Score, MD   Consent:    Consent obtained:  Verbal   Consent given by:  Patient   Risks discussed:  Fracture, nerve damage, restricted joint movement and vascular damage   Alternatives discussed:  No treatment and alternative treatmentInjury location: ankle Location details: left ankle Pre-procedure distal perfusion: normal Pre-procedure neurological function: normal Pre-procedure range of motion: reduced Immobilization: brace Splint type: ankle stirrup Splint Applied by: ED Tech Post-procedure distal perfusion: normal Post-procedure neurological function: normal Post-procedure range of motion: unchanged     (including critical care time)  Medical Decision Making / ED Course   This patient presents to the ED for concern of ankle pain and swelling, this involves an extensive number of treatment options, and is a complaint  that carries with it a high risk of complications and morbidity.  The differential diagnosis includes fracture, ligamentous injury, sprain, hematoma, contusion  MDM: Patient seen emerged part for evaluation of ankle pain and swelling.  Physical exam with swelling and mild tenderness to the lateral malleolus but is neurovascularly intact.  X-ray ankle showing a small cortical irregularity at the posterior margin of the distal shaft of the fibula concerning for a possible undisplaced fracture..  Initially we placed the patient in a cam boot but she had significant difficulty with ambulation because the boot was too heavy.  She was unable to use the crutches appropriately.  She was then transition to an Aircast with a walker and she was able to ambulate  with this combination.  Pain control provided and she will need outpatient follow-up with orthopedics of which I placed a referral.  At this time she does not meet inpatient criteria for admission and safe for discharge with outpatient orthopedic follow-up.   Additional history obtained:  -External records from outside source obtained and reviewed including: Chart review including previous notes, labs, imaging, consultation notes     Imaging Studies ordered: I ordered imaging studies including x-ray ankle I independently visualized and interpreted imaging. I agree with the radiologist interpretation   Medicines ordered and prescription drug management: Meds ordered this encounter  Medications   HYDROcodone-acetaminophen (NORCO/VICODIN) 5-325 MG per tablet 1 tablet   naproxen (NAPROSYN) 375 MG tablet    Sig: Take 1 tablet (375 mg total) by mouth 2 (two) times daily.    Dispense:  20 tablet    Refill:  0    -I have reviewed the patients home medicines and have made adjustments as needed  Critical interventions none    Cardiac Monitoring: The patient was maintained on a cardiac monitor.  I personally viewed and interpreted the cardiac  monitored which showed an underlying rhythm of: NSR  Social Determinants of Health:  Factors impacting patients care include: Lives alone   Reevaluation: After the interventions noted above, I reevaluated the patient and found that they have :improved  Co morbidities that complicate the patient evaluation  Past Medical History:  Diagnosis Date   CKD (chronic kidney disease)    Diabetes mellitus without complication (HCC)    GERD (gastroesophageal reflux disease)    Hypertension    Uterine prolapse       Dispostion: I considered admission for this patient, but at this time she does not meet inpatient criteria for admission and she is safe for discharge with outpatient follow-up     Final Clinical Impression(s) / ED Diagnoses Final diagnoses:  Acute left ankle pain     @PCDICTATION @    Glendora Score, MD 04/11/23 2155

## 2023-04-11 NOTE — ED Notes (Signed)
Transport has been called.  

## 2023-04-11 NOTE — ED Triage Notes (Signed)
Pt BIB [facility] for ground fall, per EMS pt was getting out of bed and fell, denies LOC or hitting head, c/o of left ankle pain, pt diabetic last [id number].  Per EMS, pt lives in [place] and has not transportation home.

## 2023-04-19 ENCOUNTER — Emergency Department: Payer: Medicare HMO

## 2023-04-19 ENCOUNTER — Inpatient Hospital Stay
Admission: EM | Admit: 2023-04-19 | Discharge: 2023-04-24 | DRG: 492 | Disposition: A | Discharge status: Skilled Nursing Facility | Payer: Medicare HMO | Attending: Obstetrics and Gynecology | Admitting: Obstetrics and Gynecology

## 2023-04-19 ENCOUNTER — Inpatient Hospital Stay: Payer: Medicare HMO

## 2023-04-19 ENCOUNTER — Encounter: Payer: Self-pay | Admitting: Family Medicine

## 2023-04-19 DIAGNOSIS — N179 Acute kidney failure, unspecified: Secondary | ICD-10-CM

## 2023-04-19 DIAGNOSIS — E872 Acidosis, unspecified: Secondary | ICD-10-CM | POA: Diagnosis present

## 2023-04-19 DIAGNOSIS — E1122 Type 2 diabetes mellitus with diabetic chronic kidney disease: Secondary | ICD-10-CM | POA: Diagnosis present

## 2023-04-19 DIAGNOSIS — M25572 Pain in left ankle and joints of left foot: Secondary | ICD-10-CM | POA: Diagnosis not present

## 2023-04-19 DIAGNOSIS — G9340 Encephalopathy, unspecified: Secondary | ICD-10-CM | POA: Diagnosis not present

## 2023-04-19 DIAGNOSIS — E860 Dehydration: Secondary | ICD-10-CM | POA: Diagnosis present

## 2023-04-19 DIAGNOSIS — W19XXXA Unspecified fall, initial encounter: Secondary | ICD-10-CM | POA: Diagnosis present

## 2023-04-19 DIAGNOSIS — S82852C Displaced trimalleolar fracture of left lower leg, initial encounter for open fracture type IIIA, IIIB, or IIIC: Principal | ICD-10-CM | POA: Diagnosis present

## 2023-04-19 DIAGNOSIS — S82899A Other fracture of unspecified lower leg, initial encounter for closed fracture: Secondary | ICD-10-CM

## 2023-04-19 DIAGNOSIS — Z794 Long term (current) use of insulin: Secondary | ICD-10-CM

## 2023-04-19 DIAGNOSIS — E669 Obesity, unspecified: Secondary | ICD-10-CM | POA: Diagnosis present

## 2023-04-19 DIAGNOSIS — Z23 Encounter for immunization: Secondary | ICD-10-CM | POA: Diagnosis present

## 2023-04-19 DIAGNOSIS — S82892Q Other fracture of left lower leg, subsequent encounter for open fracture type I or II with malunion: Secondary | ICD-10-CM

## 2023-04-19 DIAGNOSIS — L89152 Pressure ulcer of sacral region, stage 2: Secondary | ICD-10-CM | POA: Diagnosis present

## 2023-04-19 DIAGNOSIS — Z79899 Other long term (current) drug therapy: Secondary | ICD-10-CM | POA: Diagnosis not present

## 2023-04-19 DIAGNOSIS — I5032 Chronic diastolic (congestive) heart failure: Secondary | ICD-10-CM | POA: Diagnosis present

## 2023-04-19 DIAGNOSIS — G9341 Metabolic encephalopathy: Secondary | ICD-10-CM | POA: Diagnosis present

## 2023-04-19 DIAGNOSIS — N184 Chronic kidney disease, stage 4 (severe): Secondary | ICD-10-CM

## 2023-04-19 DIAGNOSIS — Z888 Allergy status to other drugs, medicaments and biological substances status: Secondary | ICD-10-CM | POA: Diagnosis not present

## 2023-04-19 DIAGNOSIS — N189 Chronic kidney disease, unspecified: Principal | ICD-10-CM

## 2023-04-19 DIAGNOSIS — S82852B Displaced trimalleolar fracture of left lower leg, initial encounter for open fracture type I or II: Secondary | ICD-10-CM

## 2023-04-19 DIAGNOSIS — Z6835 Body mass index (BMI) 35.0-35.9, adult: Secondary | ICD-10-CM | POA: Diagnosis not present

## 2023-04-19 DIAGNOSIS — E119 Type 2 diabetes mellitus without complications: Secondary | ICD-10-CM

## 2023-04-19 DIAGNOSIS — K219 Gastro-esophageal reflux disease without esophagitis: Secondary | ICD-10-CM | POA: Diagnosis present

## 2023-04-19 DIAGNOSIS — S82892C Other fracture of left lower leg, initial encounter for open fracture type IIIA, IIIB, or IIIC: Secondary | ICD-10-CM

## 2023-04-19 DIAGNOSIS — L8990 Pressure ulcer of unspecified site, unspecified stage: Secondary | ICD-10-CM | POA: Insufficient documentation

## 2023-04-19 DIAGNOSIS — I130 Hypertensive heart and chronic kidney disease with heart failure and stage 1 through stage 4 chronic kidney disease, or unspecified chronic kidney disease: Secondary | ICD-10-CM | POA: Diagnosis present

## 2023-04-19 DIAGNOSIS — I10 Essential (primary) hypertension: Secondary | ICD-10-CM | POA: Diagnosis present

## 2023-04-19 DIAGNOSIS — Y92009 Unspecified place in unspecified non-institutional (private) residence as the place of occurrence of the external cause: Secondary | ICD-10-CM | POA: Diagnosis not present

## 2023-04-19 DIAGNOSIS — I5030 Unspecified diastolic (congestive) heart failure: Secondary | ICD-10-CM

## 2023-04-19 DIAGNOSIS — N183 Chronic kidney disease, stage 3 unspecified: Secondary | ICD-10-CM | POA: Diagnosis present

## 2023-04-19 DIAGNOSIS — S82892A Other fracture of left lower leg, initial encounter for closed fracture: Secondary | ICD-10-CM | POA: Diagnosis not present

## 2023-04-19 DIAGNOSIS — R748 Abnormal levels of other serum enzymes: Secondary | ICD-10-CM | POA: Diagnosis not present

## 2023-04-19 DIAGNOSIS — E1165 Type 2 diabetes mellitus with hyperglycemia: Secondary | ICD-10-CM | POA: Diagnosis not present

## 2023-04-19 LAB — CBC WITH DIFFERENTIAL/PLATELET
Abs Immature Granulocytes: 0.04 10*3/uL (ref 0.00–0.07)
Basophils Absolute: 0 10*3/uL (ref 0.0–0.1)
Basophils Relative: 0 %
Eosinophils Absolute: 0.1 10*3/uL (ref 0.0–0.5)
Eosinophils Relative: 1 %
HCT: 32 % — ABNORMAL LOW (ref 36.0–46.0)
Hemoglobin: 10.2 g/dL — ABNORMAL LOW (ref 12.0–15.0)
Immature Granulocytes: 0 %
Lymphocytes Relative: 11 %
Lymphs Abs: 1.2 10*3/uL (ref 0.7–4.0)
MCH: 29.1 pg (ref 26.0–34.0)
MCHC: 31.9 g/dL (ref 30.0–36.0)
MCV: 91.4 fL (ref 80.0–100.0)
Monocytes Absolute: 0.8 10*3/uL (ref 0.1–1.0)
Monocytes Relative: 8 %
Neutro Abs: 8.5 10*3/uL — ABNORMAL HIGH (ref 1.7–7.7)
Neutrophils Relative %: 80 %
Platelets: 320 10*3/uL (ref 150–400)
RBC: 3.5 MIL/uL — ABNORMAL LOW (ref 3.87–5.11)
RDW: 14.2 % (ref 11.5–15.5)
WBC: 10.6 10*3/uL — ABNORMAL HIGH (ref 4.0–10.5)
nRBC: 0 % (ref 0.0–0.2)

## 2023-04-19 LAB — BASIC METABOLIC PANEL
Anion gap: 13 (ref 5–15)
Anion gap: 11 (ref 5–15)
BUN: 83 mg/dL — ABNORMAL HIGH (ref 8–23)
BUN: 80 mg/dL — ABNORMAL HIGH (ref 8–23)
CO2: 21 mmol/L — ABNORMAL LOW (ref 22–32)
CO2: 21 mmol/L — ABNORMAL LOW (ref 22–32)
Calcium: 8.2 mg/dL — ABNORMAL LOW (ref 8.9–10.3)
Calcium: 7.9 mg/dL — ABNORMAL LOW (ref 8.9–10.3)
Chloride: 107 mmol/L (ref 98–111)
Chloride: 106 mmol/L (ref 98–111)
Creatinine, Ser: 3.68 mg/dL — ABNORMAL HIGH (ref 0.44–1.00)
Creatinine, Ser: 3.31 mg/dL — ABNORMAL HIGH (ref 0.44–1.00)
GFR, Estimated: 12 mL/min — ABNORMAL LOW (ref >60)
GFR, Estimated: 14 mL/min — ABNORMAL LOW (ref >60)
Glucose, Bld: 80 mg/dL (ref 70–99)
Glucose, Bld: 151 mg/dL — ABNORMAL HIGH (ref 70–99)
Potassium: 3.7 mmol/L (ref 3.5–5.1)
Potassium: 3.4 mmol/L — ABNORMAL LOW (ref 3.5–5.1)
Sodium: 141 mmol/L (ref 135–145)
Sodium: 138 mmol/L (ref 135–145)

## 2023-04-19 LAB — TROPONIN I (HIGH SENSITIVITY): Troponin I (High Sensitivity): 16 ng/L (ref <18)

## 2023-04-19 LAB — CK
Total CK: 745 U/L — ABNORMAL HIGH (ref 38–234)
Total CK: 572 U/L — ABNORMAL HIGH (ref 38–234)

## 2023-04-19 LAB — GLUCOSE, CAPILLARY: Glucose-Capillary: 129 mg/dL — ABNORMAL HIGH (ref 70–99)

## 2023-04-19 LAB — CBG MONITORING, ED
Glucose-Capillary: 99 mg/dL (ref 70–99)
Glucose-Capillary: 84 mg/dL (ref 70–99)

## 2023-04-19 MED ORDER — DEXTROSE-NACL 5-0.45 % IV SOLN
INTRAVENOUS | Status: DC
  Filled 2023-04-19 (×3): qty 1000

## 2023-04-19 MED ORDER — INSULIN ASPART 100 UNIT/ML IJ SOLN
0.0000 [IU] | INTRAMUSCULAR | Status: DC
  Administered 2023-04-19: 1 [IU] via SUBCUTANEOUS
  Administered 2023-04-20: 3 [IU] via SUBCUTANEOUS
  Administered 2023-04-20: 1 [IU] via SUBCUTANEOUS
  Administered 2023-04-20: 3 [IU] via SUBCUTANEOUS
  Administered 2023-04-20 – 2023-04-21 (×3): 1 [IU] via SUBCUTANEOUS
  Administered 2023-04-21: 3 [IU] via SUBCUTANEOUS
  Administered 2023-04-21: 2 [IU] via SUBCUTANEOUS
  Administered 2023-04-21 – 2023-04-24 (×4): 1 [IU] via SUBCUTANEOUS
  Filled 2023-04-19 (×13): qty 1

## 2023-04-19 MED ORDER — HEPARIN SODIUM (PORCINE) 5000 UNIT/ML IJ SOLN
5000.0000 [IU] | Freq: Three times a day (TID) | INTRAMUSCULAR | Status: DC
  Administered 2023-04-19 – 2023-04-24 (×13): 5000 [IU] via SUBCUTANEOUS
  Filled 2023-04-19 (×13): qty 1

## 2023-04-19 MED ORDER — PROPOFOL 10 MG/ML IV BOLUS
1.0000 mg/kg | Freq: Once | INTRAVENOUS | Status: AC
  Administered 2023-04-19: 81.1 mg via INTRAVENOUS
  Filled 2023-04-19: qty 20

## 2023-04-19 MED ORDER — ONDANSETRON HCL 4 MG/2ML IJ SOLN: 4.0000 mg | Freq: Four times a day (QID) | INTRAMUSCULAR | Status: DC | PRN

## 2023-04-19 MED ORDER — SODIUM CHLORIDE 0.9 % IV BOLUS
1000.0000 mL | Freq: Once | INTRAVENOUS | Status: AC
  Administered 2023-04-19: 1000 mL via INTRAVENOUS

## 2023-04-19 MED ORDER — CEFAZOLIN SODIUM-DEXTROSE 2-4 GM/100ML-% IV SOLN
2.0000 g | Freq: Once | INTRAVENOUS | Status: AC
  Administered 2023-04-19: 2 g via INTRAVENOUS
  Filled 2023-04-19: qty 100

## 2023-04-19 MED ORDER — CEFAZOLIN SODIUM-DEXTROSE 1-4 GM/50ML-% IV SOLN
1.0000 g | Freq: Three times a day (TID) | INTRAVENOUS | Status: DC
  Administered 2023-04-19 – 2023-04-20 (×2): 1 g via INTRAVENOUS
  Filled 2023-04-19 (×3): qty 50

## 2023-04-19 MED ORDER — ONDANSETRON HCL 4 MG PO TABS: 4.0000 mg | ORAL_TABLET | Freq: Four times a day (QID) | ORAL | Status: DC | PRN

## 2023-04-19 MED ORDER — TETANUS-DIPHTH-ACELL PERTUSSIS 5-2.5-18.5 LF-MCG/0.5 IM SUSY
0.5000 mL | PREFILLED_SYRINGE | Freq: Once | INTRAMUSCULAR | Status: AC
  Administered 2023-04-19: 0.5 mL via INTRAMUSCULAR
  Filled 2023-04-19: qty 0.5

## 2023-04-19 NOTE — Assessment & Plan Note (Signed)
SSI A1c 

## 2023-04-19 NOTE — H&P (View-Only) (Signed)
Reason for Consult: Right ankle fracture Referring Physician: Corena Herter MD  Tracy Webster is an 77 y.o. female.  HPI: She presented today with mechanical fall to the [facility] ED by EMS transport.  Of note she was seen in the [facility] on 04/10/2013 with possible nondisplaced fracture of her fibula and was placed into a an Aircast for stability and was recommended for orthopedic follow-up, her sister had not heard from her and called for wellness check and was found to have fallen, EMTs brought her to [facility] ER.  Past Medical History:  Diagnosis Date   CKD (chronic kidney disease)    Diabetes mellitus without complication (HCC)    GERD (gastroesophageal reflux disease)    Hypertension    Uterine prolapse     No past surgical history on file.  No family history on file.  Social History:  reports that she has never smoked. She has never used smokeless tobacco. She reports that she does not drink alcohol and does not use drugs.  Allergies:  Allergies  Allergen Reactions   Tetracyclines & Related     Medications: I have reviewed the patient's current medications.  Results for orders placed or performed during the hospital encounter of 04/19/23 (from the past 48 hour(s))  CBG monitoring, ED     Status: None   Collection Time: 04/19/23  2:27 PM  Result Value Ref Range   Glucose-Capillary 99 70 - 99 mg/dL    Comment: Glucose reference range applies only to samples taken after fasting for at least 8 hours.  Basic metabolic panel     Status: Abnormal   Collection Time: 04/19/23  3:00 PM  Result Value Ref Range   Sodium 141 135 - 145 mmol/L   Potassium 3.7 3.5 - 5.1 mmol/L   Chloride 107 98 - 111 mmol/L   CO2 21 (L) 22 - 32 mmol/L   Glucose, Bld 80 70 - 99 mg/dL    Comment: Glucose reference range applies only to samples taken after fasting for at least 8 hours.   BUN 83 (H) 8 - 23 mg/dL   Creatinine, Ser 1.61 (H) 0.44 - 1.00 mg/dL   Calcium 8.2 (L) 8.9 - 10.3 mg/dL   GFR,  Estimated 12 (L) >60 mL/min    Comment: (NOTE) Calculated using the CKD-EPI Creatinine Equation (2021)    Anion gap 13 5 - 15    Comment: Performed at [facility], [address] [place], [place] [address]  CBC with Differential     Status: Abnormal   Collection Time: 04/19/23  3:00 PM  Result Value Ref Range   WBC 10.6 (H) 4.0 - 10.5 K/uL   RBC 3.50 (L) 3.87 - 5.11 MIL/uL   Hemoglobin 10.2 (L) 12.0 - 15.0 g/dL   HCT 54.0 (L) 98.1 - 19.1 %   MCV 91.4 80.0 - 100.0 fL   MCH 29.1 26.0 - 34.0 pg   MCHC 31.9 30.0 - 36.0 g/dL   RDW 47.8 29.5 - 62.1 %   Platelets 320 150 - 400 K/uL   nRBC 0.0 0.0 - 0.2 %   Neutrophils Relative % 80 %   Neutro Abs 8.5 (H) 1.7 - 7.7 K/uL   Lymphocytes Relative 11 %   Lymphs Abs 1.2 0.7 - 4.0 K/uL   Monocytes Relative 8 %   Monocytes Absolute 0.8 0.1 - 1.0 K/uL   Eosinophils Relative 1 %   Eosinophils Absolute 0.1 0.0 - 0.5 K/uL   Basophils Relative 0 %  Basophils Absolute 0.0 0.0 - 0.1 K/uL   Immature Granulocytes 0 %   Abs Immature Granulocytes 0.04 0.00 - 0.07 K/uL    Comment: Performed at [facility], [address] [place], [place] [address]  CK     Status: Abnormal   Collection Time: 04/19/23  3:00 PM  Result Value Ref Range   Total CK 745 (H) 38 - 234 U/L    Comment: Performed at [facility], [address] [place], [place] [address]  Troponin I (High Sensitivity)     Status: None   Collection Time: 04/19/23  3:00 PM  Result Value Ref Range   Troponin I (High Sensitivity) 16 <18 ng/L    Comment: (NOTE) Elevated high sensitivity troponin I (hsTnI) values and significant  changes across serial measurements may suggest ACS but many other  chronic and acute conditions are known to elevate hsTnI results.  Refer to the "Links" section for chest pain algorithms and additional  guidance. Performed at [facility], [address] [place], [place] [address]     CT Head Wo Contrast  Result Date:  04/19/2023 CLINICAL DATA:  Head trauma, minor (Age >= 65y) EXAM: CT HEAD WITHOUT CONTRAST TECHNIQUE: Contiguous axial images were obtained from the base of the skull through the vertex without intravenous contrast. RADIATION DOSE REDUCTION: This exam was performed according to the departmental dose-optimization program which includes automated exposure control, adjustment of the mA and/or kV according to patient size and/or use of iterative reconstruction technique. COMPARISON:  CT Head 09/11/17 FINDINGS: Brain: No evidence of acute infarction, hemorrhage, hydrocephalus, extra-axial collection or mass lesion/mass effect. Partially empty sella. Vascular: No hyperdense vessel or unexpected calcification. Skull: Normal. Negative for fracture or focal lesion. Sinuses/Orbits: No middle ear or mastoid effusion. Paranasal sinuses are clear. Orbits are unremarkable. Other: None. IMPRESSION: No acute intracranial abnormality. Electronically Signed   By: Lorenza Cambridge M.D.   On: 04/19/2023 16:34   DG Ankle Complete Left  Result Date: 04/19/2023 CLINICAL DATA:  Ankle deformity, open fracture with dislocation EXAM: LEFT ANKLE COMPLETE - 3+ VIEW COMPARISON:  04/11/2023 FINDINGS: Stage 4 Weber B fracture dislocation of the left ankle with oblique lateral malleolar and transverse medial malleolar components. Dominant distal tibial shaft component is overlapped with the talus by 1.2 cm, and displaced medially by 3.1 cm. There is resulting apex medial angulation between the main shaft fragments of the radius and ulna, and the talus and the rest of the ankle. A well-defined posterior malleolar fracture is not observed although imaging planes are far from ideal. The angulation of the hindfoot makes it difficult to assess the subtalar joints. IMPRESSION: 1. Stage 4 Weber B fracture dislocation of the left ankle with oblique lateral malleolar and transverse medial malleolar components, with substantial dislocation, for lap, and apex  medial angulation. Electronically Signed   By: Gaylyn Rong M.D.   On: 04/19/2023 16:29    Review of Systems  Constitutional:  Negative for chills and fever.  Cardiovascular:  Negative for chest pain.  Musculoskeletal:  Positive for falls and joint pain.  All other systems reviewed and are negative.  Blood pressure (!) 144/50, pulse 61, temperature 98 F (36.7 C), temperature source Oral, resp. rate (!) 29, SpO2 100%.  Vitals:   04/19/23 1820 04/19/23 1835  BP: (!) 121/45 (!) 144/50  Pulse: 60 61  Resp: (!) 25 (!) 29  Temp:    SpO2: 100% 100%    General AA&O x3. Normal mood and affect.  Vascular Dorsalis pedis and posterior tibial pulses  present 2+ left  Capillary refill normal to all digits. Pedal hair growth normal.  Neurologic Epicritic sensation grossly present.  Dermatologic (Wound) 7 mm circular laceration to medial malleolus with exposed subcutaneous and periosteal tissue  Orthopedic: Motor intact to digits BLE.  Gross deformity edema and pain to the left ankle    Assessment/Plan:  GA Type 1 open fracture Trimalleolar ankle fracture  -Imaging: Studies independently reviewed.  At time of my exam ER is planning to reduce ankle and will obtain postreduction films.  I have ordered a CT scan for surgical planning -ER to irrigate ankle. The wound is circular and would not be amenable to primary closure at this time -Antibiotics: Ancef 1 g every 8 hours x 72 hours -WB Status: NWB LLE -Surgical Plan: Plan for operative reduction and repair tomorrow with my partner Dr. Logan Bores -NPO past MN -Ice and elevate extremity  Edwin Cap 04/19/2023, 6:41 PM   Best available via secure chat for questions or concerns.

## 2023-04-19 NOTE — Assessment & Plan Note (Addendum)
Patient found down at home with noted recent left ankle fracture and concurrent CK level 745 Baseline stage IV CKD, type 2 diabetes, encephalopathy among confounding issues Nonfocal neurologic exam but with noted open ankle fracture pending reduction in the ER and operative repair by podiatry Fall precautions PT OT evaluation Anticipate SNF placement Monitor

## 2023-04-19 NOTE — Sedation Documentation (Signed)
X-ray at bedside

## 2023-04-19 NOTE — Assessment & Plan Note (Signed)
2D echo December 2018 with a EF of 60 to 65% Mildly dry on exam Gentle IV fluid hydration in setting of acute on chronic kidney disease Follow volume status closely

## 2023-04-19 NOTE — ED Notes (Signed)
Oxygen therapy ended

## 2023-04-19 NOTE — Assessment & Plan Note (Addendum)
Positive generalized confusion on presentation in the setting of acute on chronic kidney injury, found down, dehydration Suspect toxic metabolic component in setting of multiple comorbidities Unclear of general baseline CT head within normal limits Monitor mental status with treatment Anticipate placement

## 2023-04-19 NOTE — Sedation Documentation (Signed)
Patient denies pain and is resting comfortably.  

## 2023-04-19 NOTE — ED Provider Notes (Addendum)
[facility] Provider Note    Event Date/Time   First MD Initiated Contact with Patient 04/19/23 1505     (approximate)   History   Ankle Pain   HPI  Tracy Webster is a 77 y.o. female past medical history significant for CKD, hypertension, diabetes, GERD, presents to the emergency department after being found down.  According to triage note patient had a wellness check today after being called from sister after not hearing from her and was found down.  Patient was found to have an obvious deformity to her left leg.  On chart review patient had a recent ED visit at [facility] with [facility], she was having left ankle pain at that time and rolled her ankle getting out of bed.  Was found to have a questionable nondisplaced fibular fracture and was placed in an Aircast given difficulty with ambulation with a walking boot.  Patient believes that she ate breakfast this morning at 1030 and states that she had popcorn.  Denies being on any anticoagulation.  She is uncertain of the events following her fall.  Denies any head injury or loss of consciousness.  Denies any pain to her neck, back or anywhere except her left ankle.     Physical Exam   Triage Vital Signs: ED Triage Vitals [04/19/23 1440]  Encounter Vitals Group     BP (!) 143/50     Systolic BP Percentile      Diastolic BP Percentile      Pulse Rate 66     Resp 18     Temp 98 F (36.7 C)     Temp Source Oral     SpO2 100 %     Weight      Height      Head Circumference      Peak Flow      Pain Score 0     Pain Loc      Pain Education      Exclude from Growth Chart     Most recent vital signs: Vitals:   04/19/23 1835 04/19/23 1920  BP: (!) 144/50   Pulse: 61 62  Resp: (!) 29 (!) 23  Temp:    SpO2: 100% 98%    Physical Exam Constitutional:      Appearance: She is well-developed.  HENT:     Head: Atraumatic.  Eyes:     Conjunctiva/sclera: Conjunctivae normal.  Cardiovascular:      Rate and Rhythm: Regular rhythm.  Pulmonary:     Effort: No respiratory distress.  Abdominal:     General: There is no distension.  Musculoskeletal:        General: Swelling and deformity present. Normal range of motion.     Cervical back: Normal range of motion. No tenderness.     Comments: Deformity to the left ankle.  Open wound to the medial aspect of the left ankle.  +2 DP pulses.  Good capillary refill.  Sensation intact.  No tenderness to palpation to the knee or hip.  Skin:    General: Skin is warm.     Capillary Refill: Capillary refill takes less than 2 seconds.  Neurological:     Mental Status: She is alert. Mental status is at baseline.      IMPRESSION / MDM / ASSESSMENT AND PLAN / ED COURSE  I reviewed the triage vital signs and the nursing notes.  Patient presents to the emergency department after being found down.  Patient with obvious wound to the left ankle.  Differential diagnosis including fall, dysrhythmia, open fracture, urinary tract infection, rhabdomyolysis  On chart review patient was recently evaluated 8 [facility] on 8/7 and had a questionable fracture to her left ankle, it was not open and it was not displaced at that time.  Do not states that the patient is on any anticoagulation   RADIOLOGY my interpretation of imaging: Significant displaced distal fibula/fibular fracture   Labs (all labs ordered are listed, but only abnormal results are displayed) Labs interpreted as -    Labs Reviewed  BASIC METABOLIC PANEL - Abnormal; Notable for the following components:      Result Value   CO2 21 (*)    BUN 83 (*)    Creatinine, Ser 3.68 (*)    Calcium 8.2 (*)    GFR, Estimated 12 (*)    All other components within normal limits  CBC WITH DIFFERENTIAL/PLATELET - Abnormal; Notable for the following components:   WBC 10.6 (*)    RBC 3.50 (*)    Hemoglobin 10.2 (*)    HCT 32.0 (*)    Neutro Abs 8.5 (*)    All other components within  normal limits  CK - Abnormal; Notable for the following components:   Total CK 745 (*)    All other components within normal limits  URINALYSIS, W/ REFLEX TO CULTURE (INFECTION SUSPECTED)  CBC  COMPREHENSIVE METABOLIC PANEL  HEMOGLOBIN A1C  CK  VITAMIN D 25 HYDROXY (VIT D DEFICIENCY, FRACTURES)  CBG MONITORING, ED  TROPONIN I (HIGH SENSITIVITY)      Clinical picture concerning for open fracture.  Patient was given IV Ancef.  Tetanus was updated.  Added on CT scan of the head given unknown downtime with questionable altered mental status given that the patient stating that it is Thursday when it is Wednesday.  No obvious head trauma.  Has a nonfocal neurologic exam, have a low suspicion for CVA.  No midline cervical spine tenderness have a low suspicion for cervical spine injury.  No tenderness to bilateral hips  Significant acute on chronic kidney injury, adding on CK and UA to evaluate for possible rhabdomyolysis or urinary tract infection.  Started on IV fluids.  Clinical picture is not consistent with rhabdomyolysis.  Discussed with podiatry Dr. Lilian Kapur who came and evaluated the patient in the emergency department.  Recommended irrigating out the wound.  Irrigated out with 1 L of sterile saline copiously.  Reduction with propofol sedation and splinting.  Wanted to be notified if continued to be significantly unstable with plan to go to the OR tonight otherwise would be n.p.o. at midnight with plan to go to the OR tomorrow.  Dr. Lilian Kapur stated could be n.p.o. at midnight with plan for surgical fixation tomorrow after review of post-reduction XR.    Notified by nursing staff that DSS involved in case - informed to message hospitalist since under their care.    PROCEDURES:  Critical Care performed: yes  .Critical Care  Performed by: Corena Herter, MD Authorized by: Corena Herter, MD   Critical care provider statement:    Critical care time (minutes):  30   Critical care  time was exclusive of:  Separately billable procedures and treating other patients   Critical care was necessary to treat or prevent imminent or life-threatening deterioration of the following conditions:  Trauma   Critical care was time spent personally by me on the following activities:  Development of treatment plan with  patient or surrogate, discussions with consultants, evaluation of patient's response to treatment, examination of patient, ordering and review of laboratory studies, ordering and review of radiographic studies, ordering and performing treatments and interventions, pulse oximetry, re-evaluation of patient's condition and review of old charts .Sedation  Date/Time: 04/19/2023 6:20 PM  Performed by: Corena Herter, MD Authorized by: Corena Herter, MD   Consent:    Consent obtained:  Verbal   Consent given by:  Patient   Risks discussed:  Allergic reaction, dysrhythmia, inadequate sedation, nausea, prolonged hypoxia resulting in organ damage, prolonged sedation necessitating reversal, respiratory compromise necessitating ventilatory assistance and intubation and vomiting   Alternatives discussed:  Analgesia without sedation, anxiolysis and regional anesthesia Universal protocol:    Procedure explained and questions answered to patient or proxy's satisfaction: yes     Relevant documents present and verified: yes     Test results available: yes     Imaging studies available: yes     Required blood products, implants, devices, and special equipment available: yes     Site/side marked: yes     Immediately prior to procedure, a time out was called: yes     Patient identity confirmed:  Verbally with patient Indications:    Procedure necessitating sedation performed by:  Physician performing sedation Pre-sedation assessment:    Time since last food or drink:  10 am   ASA classification: class 2 - patient with mild systemic disease     Mouth opening:  2 finger widths   Thyromental  distance:  3 finger widths   Mallampati score:  II - soft palate, uvula, fauces visible   Neck mobility: normal     Pre-sedation assessments completed and reviewed: airway patency, cardiovascular function, hydration status, mental status, nausea/vomiting, pain level, respiratory function and temperature     Pre-sedation assessment completed:  04/19/2023 5:21 PM Immediate pre-procedure details:    Reassessment: Patient reassessed immediately prior to procedure     Reviewed: vital signs, relevant labs/tests and NPO status     Verified: bag valve mask available, emergency equipment available, intubation equipment available, IV patency confirmed, oxygen available and suction available   Procedure details (see MAR for exact dosages):    Preoxygenation:  Nasal cannula   Sedation:  Propofol   Intended level of sedation: deep   Intra-procedure monitoring:  Blood pressure monitoring, cardiac monitor, continuous pulse oximetry, frequent LOC assessments, frequent vital sign checks and continuous capnometry   Intra-procedure events: none     Total Provider sedation time (minutes):  25 Post-procedure details:    Post-sedation assessment completed:  04/19/2023 6:21 PM   Attendance: Constant attendance by certified staff until patient recovered     Recovery: Patient returned to pre-procedure baseline     Post-sedation assessments completed and reviewed: airway patency, cardiovascular function, hydration status, mental status, nausea/vomiting, pain level, respiratory function and temperature     Patient is stable for discharge or admission: yes     Procedure completion:  Tolerated well, no immediate complications Reduction of fracture  Date/Time: 04/19/2023 6:21 PM  Performed by: Corena Herter, MD Authorized by: Corena Herter, MD  Consent: Verbal consent obtained. Written consent obtained. Risks and benefits: risks, benefits and alternatives were discussed Consent given by: patient Patient  understanding: patient states understanding of the procedure being performed Patient consent: the patient's understanding of the procedure matches consent given Procedure consent: procedure consent matches procedure scheduled Relevant documents: relevant documents present and verified Imaging studies: imaging studies available Required items: required blood products,  implants, devices, and special equipment available Patient identity confirmed: verbally with patient and arm band Time out: Immediately prior to procedure a "time out" was called to verify the correct patient, procedure, equipment, support staff and site/side marked as required. Preparation: Patient was prepped and draped in the usual sterile fashion.  Sedation: Patient sedated: yes Sedation type: moderate (conscious) sedation Sedatives: propofol Sedation start date/time: 04/19/2023 5:52 PM Sedation end date/time: 04/19/2023 6:22 PM  Patient tolerance: patient tolerated the procedure well with no immediate complications   .Splint Application  Date/Time: 04/19/2023 6:22 PM  Performed by: Corena Herter, MD Authorized by: Corena Herter, MD   Consent:    Consent obtained:  Verbal   Consent given by:  Patient   Risks, benefits, and alternatives were discussed: yes     Risks discussed:  Discoloration, numbness, pain and swelling   Alternatives discussed:  Delayed treatment Universal protocol:    Procedure explained and questions answered to patient or proxy's satisfaction: yes     Relevant documents present and verified: yes     Test results available: yes     Imaging studies available: yes     Required blood products, implants, devices, and special equipment available: yes     Site/side marked: yes     Immediately prior to procedure a time out was called: yes     Patient identity confirmed:  Verbally with patient, arm band and hospital-assigned identification number Pre-procedure details:    Distal neurologic exam:   Normal   Distal perfusion: distal pulses strong   Procedure details:    Location:  Ankle   Ankle location:  L ankle   Cast type:  Short leg   Splint type:  Short leg   Supplies:  Fiberglass   Attestation: Splint applied and adjusted personally by me   Post-procedure details:    Distal neurologic exam:  Normal   Distal perfusion: distal pulses strong     Procedure completion:  Tolerated well, no immediate complications   Post-procedure imaging: reviewed     Patient's presentation is most consistent with acute presentation with potential threat to life or bodily function.   MEDICATIONS ORDERED IN ED: Medications  dextrose 5 %-0.45 % sodium chloride infusion ( Intravenous New Bag/Given 04/19/23 1833)  heparin injection 5,000 Units (has no administration in time range)  ondansetron (ZOFRAN) tablet 4 mg (has no administration in time range)    Or  ondansetron (ZOFRAN) injection 4 mg (has no administration in time range)  insulin aspart (novoLOG) injection 0-9 Units ( Subcutaneous Not Given 04/19/23 1919)  ceFAZolin (ANCEF) IVPB 2g/100 mL premix (0 g Intravenous Stopped 04/19/23 1737)  Tdap (BOOSTRIX) injection 0.5 mL (0.5 mLs Intramuscular Given 04/19/23 1525)  sodium chloride 0.9 % bolus 1,000 mL (0 mLs Intravenous Stopped 04/19/23 1917)  propofol (DIPRIVAN) 10 mg/mL bolus/IV push 81.1 mg (81.1 mg Intravenous Given 04/19/23 1804)    FINAL CLINICAL IMPRESSION(S) / ED DIAGNOSES   Final diagnoses:  Acute renal failure superimposed on chronic kidney disease, unspecified acute renal failure type, unspecified CKD stage (HCC)  Type III open fracture of left ankle, initial encounter     Rx / DC Orders   ED Discharge Orders     None        Note:  This document was prepared using Dragon voice recognition software and may include unintentional dictation errors.   Corena Herter, MD 04/19/23 Kristeen Mans    Corena Herter, MD 04/19/23 1925

## 2023-04-19 NOTE — ED Notes (Signed)
Attempted to call Royston Bake with [place] [facility]. Number is [phone]. Informed admitting provider.

## 2023-04-19 NOTE — Assessment & Plan Note (Addendum)
Noted evaluation on August 6 for fall and secondary undisplaced ankle fracture Now presenting with Stage 4 Weber B fracture dislocation of the left ankle with oblique lateral malleolar and transverse medial malleolar components, with substantial dislocation Pending reduction in the ER by Dr. Arnoldo Morale Dr. Lilian Kapur with podiatry contacted and conversation with Dr. Arnoldo Morale who will plan for operative repair in the next 1 to 2 days Pain control PT OT evaluation postoperatively Follow-up formal podiatry recommendations

## 2023-04-19 NOTE — H&P (Addendum)
History and Physical    Patient: Tracy Webster ZOX:[id number] DOB: [birth year] DOA: 04/19/2023 DOS: the patient was seen and examined on 04/19/2023 PCP: Jodi Marble, NP  Patient coming from: Home  Chief Complaint:  Chief Complaint  Patient presents with   Ankle Pain   HPI: Tracy Webster is a 77 y.o. female with medical history significant of diastolic heart failure, GERD, hypertension, stage IV CKD, morbid obesity presenting with fall, encephalopathy, ankle fracture, acute on chronic kidney disease.  Limited history in the setting of cephalopathy.  Per report, patient noted to have been seen August 6 in the ER for noted fall and secondary ankle fracture.  Per report, patient found down at home after wellness check perform a sister.  Patient does not recollect what happened.  Patient does note having issues with her ankle recently.  Patient denies any chest pain, shortness of breath, nausea vomiting or weakness.  Patient is unclear of what medication she takes. Presented to the ER afebrile, hemodynamically stable.  Satting well on room air.  White count 10.6, hemoglobin 10.2, platelets 320, creatinine 3.7 with GFR 12, CK7 45, troponin within no limits.  CT head within normal limits.  Left ankle plain films Stage 4 Weber B fracture dislocation of the left ankle with oblique lateral malleolar and transverse medial malleolar components, with substantial dislocation. Pending reduction in ER by Dr. Arnoldo Morale.  Review of Systems: As mentioned in the history of present illness. All other systems reviewed and are negative. Past Medical History:  Diagnosis Date   CKD (chronic kidney disease)    Diabetes mellitus without complication (HCC)    GERD (gastroesophageal reflux disease)    Hypertension    Uterine prolapse    No past surgical history on file. Social History:  reports that she has never smoked. She has never used smokeless tobacco. She reports that she does not drink alcohol and does not  use drugs.  Allergies  Allergen Reactions   Tetracyclines & Related     No family history on file.  Prior to Admission medications   Medication Sig Start Date End Date Taking? Authorizing Provider  acetaminophen (TYLENOL) 500 MG tablet Take 1,000 mg by mouth every 6 (six) hours as needed for mild pain, moderate pain or fever.     [provider]  albuterol (PROVENTIL) (2.5 MG/3ML) 0.083% nebulizer solution Take 3 mLs (2.5 mg total) by nebulization every 4 (four) hours as needed for wheezing or shortness of breath. 08/17/17   Langston Reusing, MD  carvedilol (COREG) 12.5 MG tablet Take 12.5 mg by mouth 2 (two) times daily with a meal.    [provider]  diltiazem (CARDIZEM CD) 300 MG 24 hr capsule Take 300 mg by mouth daily.    [provider]  fexofenadine (ALLEGRA) 180 MG tablet Take 180 mg by mouth daily as needed for allergies.     [provider]  furosemide (LASIX) 20 MG tablet Take 20 mg by mouth daily. 07/10/17   [provider]  insulin aspart protamine- aspart (NOVOLOG MIX 70/30) (70-30) 100 UNIT/ML injection Inject 0.08 mLs (8 Units total) into the skin 2 (two) times daily with a meal. 09/12/17   Rhetta Mura, MD  ipratropium-albuterol (DUONEB) 0.5-2.5 (3) MG/3ML SOLN Take 3 mLs by nebulization every 6 (six) hours for 4 days. Patient not taking: Reported on 09/11/2017 08/17/17 08/21/17  Langston Reusing, MD  isosorbide mononitrate (IMDUR) 60 MG 24 hr tablet Take 60 mg by mouth every morning.  [provider]  lisinopril (PRINIVIL,ZESTRIL) 40 MG tablet Take 1 tablet by mouth daily. 09/22/15   [provider]  lovastatin (MEVACOR) 20 MG tablet Take 20 mg by mouth every evening.    [provider]  Multiple Vitamins-Minerals (CENTRUM SILVER) tablet Take 1 tablet by mouth daily.    [provider]  naproxen (NAPROSYN) 375 MG tablet Take 1 tablet (375 mg total) by mouth 2 (two) times daily.  04/11/23   Kommor, [place], MD  ranitidine (ZANTAC) 150 MG capsule Take 1 capsule (150 mg total) by mouth 2 (two) times daily. 02/15/16   Sharman Cheek, MD    Physical Exam: Vitals:   04/19/23 1440 04/19/23 1606 04/19/23 1745  BP: (!) 143/50 (!) 148/48 (!) 151/50  Pulse: 66 72 76  Resp: 18 18 (!) 22  Temp: 98 F (36.7 C)    TempSrc: Oral    SpO2: 100% 100% 100%   Physical Exam Constitutional:      Appearance: She is obese.     Comments: + disheveled appearing    HENT:     Head: Normocephalic and atraumatic.     Mouth/Throat:     Mouth: Mucous membranes are dry.  Eyes:     Extraocular Movements: Extraocular movements intact.  Cardiovascular:     Rate and Rhythm: Normal rate and regular rhythm.  Pulmonary:     Effort: Pulmonary effort is normal.  Abdominal:     General: Bowel sounds are normal.  Musculoskeletal:     Comments: + marked L medial ankle swelling w/ small amount of bony exposure   Neurological:     General: No focal deficit present.     Comments: + generalized confusion    Psychiatric:        Mood and Affect: Mood normal.     Data Reviewed:  There are no new results to review at this time. CT Head Wo Contrast CLINICAL DATA:  Head trauma, minor (Age >= 65y)  EXAM: CT HEAD WITHOUT CONTRAST  TECHNIQUE: Contiguous axial images were obtained from the base of the skull through the vertex without intravenous contrast.  RADIATION DOSE REDUCTION: This exam was performed according to the departmental dose-optimization program which includes automated exposure control, adjustment of the mA and/or kV according to patient size and/or use of iterative reconstruction technique.  COMPARISON:  CT Head 09/11/17  FINDINGS: Brain: No evidence of acute infarction, hemorrhage, hydrocephalus, extra-axial collection or mass lesion/mass effect. Partially empty sella.  Vascular: No hyperdense vessel or unexpected calcification.  Skull: Normal. Negative for  fracture or focal lesion.  Sinuses/Orbits: No middle ear or mastoid effusion. Paranasal sinuses are clear. Orbits are unremarkable.  Other: None.  IMPRESSION: No acute intracranial abnormality.  Electronically Signed   By: Lorenza Cambridge M.D.   On: 04/19/2023 16:34 DG Ankle Complete Left CLINICAL DATA:  Ankle deformity, open fracture with dislocation  EXAM: LEFT ANKLE COMPLETE - 3+ VIEW  COMPARISON:  04/11/2023  FINDINGS: Stage 4 Weber B fracture dislocation of the left ankle with oblique lateral malleolar and transverse medial malleolar components. Dominant distal tibial shaft component is overlapped with the talus by 1.2 cm, and displaced medially by 3.1 cm. There is resulting apex medial angulation between the main shaft fragments of the radius and ulna, and the talus and the rest of the ankle. A well-defined posterior malleolar fracture is not observed although imaging planes are far from ideal. The angulation of the hindfoot makes it difficult to assess the subtalar joints.  IMPRESSION:  1. Stage 4 Weber B fracture dislocation of the left ankle with oblique lateral malleolar and transverse medial malleolar components, with substantial dislocation, for lap, and apex medial angulation.  Electronically Signed   By: Gaylyn Rong M.D.   On: 04/19/2023 16:29  Lab Results  Component Value Date   WBC 10.6 (H) 04/19/2023   HGB 10.2 (L) 04/19/2023   HCT 32.0 (L) 04/19/2023   MCV 91.4 04/19/2023   PLT 320 04/19/2023   Last metabolic panel Lab Results  Component Value Date   GLUCOSE 80 04/19/2023   NA 141 04/19/2023   K 3.7 04/19/2023   CL 107 04/19/2023   CO2 21 (L) 04/19/2023   BUN 83 (H) 04/19/2023   CREATININE 3.68 (H) 04/19/2023   GFRNONAA 12 (L) 04/19/2023   CALCIUM 8.2 (L) 04/19/2023   PROT 6.2 (L) 09/12/2017   ALBUMIN 3.1 (L) 09/12/2017   BILITOT 0.7 09/12/2017   ALKPHOS 77 09/12/2017   AST 21 09/12/2017   ALT 14 09/12/2017   ANIONGAP 13  04/19/2023    Assessment and Plan: * Fall Patient found down at home with noted recent left ankle fracture and concurrent CK level 745 Baseline stage IV CKD, type 2 diabetes, encephalopathy among confounding issues Nonfocal neurologic exam but with noted open ankle fracture pending reduction in the ER and operative repair by podiatry Fall precautions PT OT evaluation Anticipate SNF placement Monitor  Acute renal failure superimposed on stage 4 chronic kidney disease (HCC) Creatinine 3.7 today with GFR 12 Baseline creatinine appears to be 1.7-2.3 Suspect secondary to dehydration/decreased Po intake with patient noted to be found down IV fluid hydration overnight Hold nephrotoxic agents Nephrology consult as clinically indicated  Ankle fracture Noted evaluation on August 6 for fall and secondary undisplaced ankle fracture Now presenting with Stage 4 Weber B fracture dislocation of the left ankle with oblique lateral malleolar and transverse medial malleolar components, with substantial dislocation Pending reduction in the ER by Dr. Arnoldo Morale Dr. Lilian Kapur with podiatry contacted and conversation with Dr. Arnoldo Morale who will plan for operative repair in the next 1 to 2 days Pain control PT OT evaluation postoperatively Follow-up formal podiatry recommendations   Acute encephalopathy Positive generalized confusion on presentation in the setting of acute on chronic kidney injury, found down, dehydration Suspect toxic metabolic component in setting of multiple comorbidities Unclear of general baseline CT head within normal limits Monitor mental status with treatment Anticipate placement  (HFpEF) heart failure with preserved ejection fraction (HCC) 2D echo December 2018 with a EF of 60 to 65% Mildly dry on exam Gentle IV fluid hydration in setting of acute on chronic kidney disease Follow volume status closely  Insulin-requiring or dependent type II diabetes mellitus  (HCC) SSI A1c      Advance Care Planning:   Code Status: Full Code   Consults: None- consider nephrology   Family Communication: No family at the bedside   Severity of Illness: The appropriate patient status for this patient is INPATIENT. Inpatient status is judged to be reasonable and necessary in order to provide the required intensity of service to ensure the patient's safety. The patient's presenting symptoms, physical exam findings, and initial radiographic and laboratory data in the context of their chronic comorbidities is felt to place them at high risk for further clinical deterioration. Furthermore, it is not anticipated that the patient will be medically stable for discharge from the hospital within 2 midnights of admission.   * I certify that at the point of admission  it is my clinical judgment that the patient will require inpatient hospital care spanning beyond 2 midnights from the point of admission due to high intensity of service, high risk for further deterioration and high frequency of surveillance required.*  Author: Floydene Flock, MD 04/19/2023 5:57 PM  For on call review [email or web address].

## 2023-04-19 NOTE — Sedation Documentation (Signed)
Xray called

## 2023-04-19 NOTE — Assessment & Plan Note (Signed)
Creatinine 3.7 today with GFR 12 Baseline creatinine appears to be 1.7-2.3 Suspect secondary to dehydration/decreased Po intake with patient noted to be found down IV fluid hydration overnight Hold nephrotoxic agents Nephrology consult as clinically indicated

## 2023-04-19 NOTE — Sedation Documentation (Signed)
Procedure end

## 2023-04-19 NOTE — Consult Note (Addendum)
Reason for Consult: Right ankle fracture Referring Physician: Corena Herter MD  Tracy Webster is an 77 y.o. female.  HPI: She presented today with mechanical fall to the [facility] ED by EMS transport.  Of note she was seen in the [facility] ER on 04/10/2013 with possible nondisplaced fracture of her fibula and was placed into a an Aircast for stability and was recommended for orthopedic follow-up, her sister had not heard from her and called for wellness check and was found to have fallen, EMTs brought her to [facility] ER.  Past Medical History:  Diagnosis Date   CKD (chronic kidney disease)    Diabetes mellitus without complication (HCC)    GERD (gastroesophageal reflux disease)    Hypertension    Uterine prolapse     No past surgical history on file.  No family history on file.  Social History:  reports that she has never smoked. She has never used smokeless tobacco. She reports that she does not drink alcohol and does not use drugs.  Allergies:  Allergies  Allergen Reactions   Tetracyclines & Related     Medications: I have reviewed the patient's current medications.  Results for orders placed or performed during the hospital encounter of 04/19/23 (from the past 48 hour(s))  CBG monitoring, ED     Status: None   Collection Time: 04/19/23  2:27 PM  Result Value Ref Range   Glucose-Capillary 99 70 - 99 mg/dL    Comment: Glucose reference range applies only to samples taken after fasting for at least 8 hours.  Basic metabolic panel     Status: Abnormal   Collection Time: 04/19/23  3:00 PM  Result Value Ref Range   Sodium 141 135 - 145 mmol/L   Potassium 3.7 3.5 - 5.1 mmol/L   Chloride 107 98 - 111 mmol/L   CO2 21 (L) 22 - 32 mmol/L   Glucose, Bld 80 70 - 99 mg/dL    Comment: Glucose reference range applies only to samples taken after fasting for at least 8 hours.   BUN 83 (H) 8 - 23 mg/dL   Creatinine, Ser 1.61 (H) 0.44 - 1.00 mg/dL   Calcium 8.2 (L) 8.9 - 10.3 mg/dL   GFR,  Estimated 12 (L) >60 mL/min    Comment: (NOTE) Calculated using the CKD-EPI Creatinine Equation (2021)    Anion gap 13 5 - 15    Comment: Performed at [facility], [address] [place], [place] [address]  CBC with Differential     Status: Abnormal   Collection Time: 04/19/23  3:00 PM  Result Value Ref Range   WBC 10.6 (H) 4.0 - 10.5 K/uL   RBC 3.50 (L) 3.87 - 5.11 MIL/uL   Hemoglobin 10.2 (L) 12.0 - 15.0 g/dL   HCT 54.0 (L) 98.1 - 19.1 %   MCV 91.4 80.0 - 100.0 fL   MCH 29.1 26.0 - 34.0 pg   MCHC 31.9 30.0 - 36.0 g/dL   RDW 47.8 29.5 - 62.1 %   Platelets 320 150 - 400 K/uL   nRBC 0.0 0.0 - 0.2 %   Neutrophils Relative % 80 %   Neutro Abs 8.5 (H) 1.7 - 7.7 K/uL   Lymphocytes Relative 11 %   Lymphs Abs 1.2 0.7 - 4.0 K/uL   Monocytes Relative 8 %   Monocytes Absolute 0.8 0.1 - 1.0 K/uL   Eosinophils Relative 1 %   Eosinophils Absolute 0.1 0.0 - 0.5 K/uL   Basophils Relative 0 %  Basophils Absolute 0.0 0.0 - 0.1 K/uL   Immature Granulocytes 0 %   Abs Immature Granulocytes 0.04 0.00 - 0.07 K/uL    Comment: Performed at [facility], [address] [place], [place] [address]  CK     Status: Abnormal   Collection Time: 04/19/23  3:00 PM  Result Value Ref Range   Total CK 745 (H) 38 - 234 U/L    Comment: Performed at [facility], [address] [place], [place] [address]  Troponin I (High Sensitivity)     Status: None   Collection Time: 04/19/23  3:00 PM  Result Value Ref Range   Troponin I (High Sensitivity) 16 <18 ng/L    Comment: (NOTE) Elevated high sensitivity troponin I (hsTnI) values and significant  changes across serial measurements may suggest ACS but many other  chronic and acute conditions are known to elevate hsTnI results.  Refer to the "Links" section for chest pain algorithms and additional  guidance. Performed at [facility], [address] [place], [place] [address]     CT Head Wo Contrast  Result Date:  04/19/2023 CLINICAL DATA:  Head trauma, minor (Age >= 65y) EXAM: CT HEAD WITHOUT CONTRAST TECHNIQUE: Contiguous axial images were obtained from the base of the skull through the vertex without intravenous contrast. RADIATION DOSE REDUCTION: This exam was performed according to the departmental dose-optimization program which includes automated exposure control, adjustment of the mA and/or kV according to patient size and/or use of iterative reconstruction technique. COMPARISON:  CT Head 09/11/17 FINDINGS: Brain: No evidence of acute infarction, hemorrhage, hydrocephalus, extra-axial collection or mass lesion/mass effect. Partially empty sella. Vascular: No hyperdense vessel or unexpected calcification. Skull: Normal. Negative for fracture or focal lesion. Sinuses/Orbits: No middle ear or mastoid effusion. Paranasal sinuses are clear. Orbits are unremarkable. Other: None. IMPRESSION: No acute intracranial abnormality. Electronically Signed   By: Lorenza Cambridge M.D.   On: 04/19/2023 16:34   DG Ankle Complete Left  Result Date: 04/19/2023 CLINICAL DATA:  Ankle deformity, open fracture with dislocation EXAM: LEFT ANKLE COMPLETE - 3+ VIEW COMPARISON:  04/11/2023 FINDINGS: Stage 4 Weber B fracture dislocation of the left ankle with oblique lateral malleolar and transverse medial malleolar components. Dominant distal tibial shaft component is overlapped with the talus by 1.2 cm, and displaced medially by 3.1 cm. There is resulting apex medial angulation between the main shaft fragments of the radius and ulna, and the talus and the rest of the ankle. A well-defined posterior malleolar fracture is not observed although imaging planes are far from ideal. The angulation of the hindfoot makes it difficult to assess the subtalar joints. IMPRESSION: 1. Stage 4 Weber B fracture dislocation of the left ankle with oblique lateral malleolar and transverse medial malleolar components, with substantial dislocation, for lap, and apex  medial angulation. Electronically Signed   By: Gaylyn Rong M.D.   On: 04/19/2023 16:29    Review of Systems  Constitutional:  Negative for chills and fever.  Cardiovascular:  Negative for chest pain.  Musculoskeletal:  Positive for falls and joint pain.  All other systems reviewed and are negative.  Blood pressure (!) 144/50, pulse 61, temperature 98 F (36.7 C), temperature source Oral, resp. rate (!) 29, SpO2 100%.  Vitals:   04/19/23 1820 04/19/23 1835  BP: (!) 121/45 (!) 144/50  Pulse: 60 61  Resp: (!) 25 (!) 29  Temp:    SpO2: 100% 100%    General AA&O x3. Normal mood and affect.  Vascular Dorsalis pedis and posterior tibial pulses  present 2+ left  Capillary refill normal to all digits. Pedal hair growth normal.  Neurologic Epicritic sensation grossly present.  Dermatologic (Wound) 7 mm circular laceration to medial malleolus with exposed subcutaneous and periosteal tissue  Orthopedic: Motor intact to digits BLE.  Gross deformity edema and pain to the left ankle    Assessment/Plan:  GA Type 1 open fracture Trimalleolar ankle fracture  -Imaging: Studies independently reviewed.  At time of my exam ER is planning to reduce ankle and will obtain postreduction films.  I have ordered a CT scan for surgical planning -ER to irrigate ankle. The wound is circular and would not be amenable to primary closure at this time -Antibiotics: Ancef 1 g every 8 hours x 72 hours -WB Status: NWB LLE -Surgical Plan: Plan for operative reduction and repair tomorrow with my partner Dr. Logan Bores -NPO past MN -Ice and elevate extremity  Edwin Cap 04/19/2023, 6:41 PM   Best available via secure chat for questions or concerns.

## 2023-04-19 NOTE — ED Triage Notes (Addendum)
Pt presents to the ED via [facility]. Pt lives at home alone and her sister Pamelia Hoit hadn't heard from her in a week and called for a wellness check. Pt has a small round wound to inner aspect of left ankle. Wound was actively bleeding. Upon patient's arrival no active bleeding. Known fracture to that ankle. Pt denies pain. Pt A&Ox4. Pt does not know the day of the week, but this is baseline for patient.  Pt's BG was 63 with EMS. EMS gave 100 of D10. Repeat BG 143. BG at time of triage 99.

## 2023-04-19 NOTE — Sedation Documentation (Addendum)
Procedure start. Wound irrigated by Mumma, MD. Ankle reduced.

## 2023-04-19 NOTE — Sedation Documentation (Signed)
Oxygen started via [place] r/t O2 saturation 89%.

## 2023-04-20 ENCOUNTER — Inpatient Hospital Stay: Payer: Medicare HMO

## 2023-04-20 ENCOUNTER — Inpatient Hospital Stay: Payer: Medicare HMO | Admitting: Anesthesiology

## 2023-04-20 ENCOUNTER — Encounter: Payer: Self-pay | Admitting: Family Medicine

## 2023-04-20 ENCOUNTER — Other Ambulatory Visit: Payer: Self-pay

## 2023-04-20 ENCOUNTER — Encounter
Admission: EM | Disposition: A | Discharge status: Skilled Nursing Facility | Payer: Self-pay | Source: Home / Self Care | Attending: Obstetrics and Gynecology

## 2023-04-20 DIAGNOSIS — S82892A Other fracture of left lower leg, initial encounter for closed fracture: Secondary | ICD-10-CM

## 2023-04-20 DIAGNOSIS — S82852B Displaced trimalleolar fracture of left lower leg, initial encounter for open fracture type I or II: Secondary | ICD-10-CM

## 2023-04-20 DIAGNOSIS — L8990 Pressure ulcer of unspecified site, unspecified stage: Secondary | ICD-10-CM | POA: Insufficient documentation

## 2023-04-20 HISTORY — PX: ORIF ANKLE FRACTURE: SHX5408

## 2023-04-20 LAB — CBC
HCT: 31.4 % — ABNORMAL LOW (ref 36.0–46.0)
Hemoglobin: 9.9 g/dL — ABNORMAL LOW (ref 12.0–15.0)
MCH: 28.9 pg (ref 26.0–34.0)
MCHC: 31.5 g/dL (ref 30.0–36.0)
MCV: 91.5 fL (ref 80.0–100.0)
Platelets: 334 10*3/uL (ref 150–400)
RBC: 3.43 MIL/uL — ABNORMAL LOW (ref 3.87–5.11)
RDW: 14.4 % (ref 11.5–15.5)
WBC: 8.7 10*3/uL (ref 4.0–10.5)
nRBC: 0 % (ref 0.0–0.2)

## 2023-04-20 LAB — GLUCOSE, CAPILLARY
Glucose-Capillary: 224 mg/dL — ABNORMAL HIGH (ref 70–99)
Glucose-Capillary: 227 mg/dL — ABNORMAL HIGH (ref 70–99)
Glucose-Capillary: 212 mg/dL — ABNORMAL HIGH (ref 70–99)
Glucose-Capillary: 141 mg/dL — ABNORMAL HIGH (ref 70–99)
Glucose-Capillary: 112 mg/dL — ABNORMAL HIGH (ref 70–99)
Glucose-Capillary: 126 mg/dL — ABNORMAL HIGH (ref 70–99)
Glucose-Capillary: 129 mg/dL — ABNORMAL HIGH (ref 70–99)

## 2023-04-20 LAB — COMPREHENSIVE METABOLIC PANEL
ALT: 19 U/L (ref 0–44)
AST: 25 U/L (ref 15–41)
Albumin: 2.3 g/dL — ABNORMAL LOW (ref 3.5–5.0)
Alkaline Phosphatase: 59 U/L (ref 38–126)
Anion gap: 10 (ref 5–15)
BUN: 81 mg/dL — ABNORMAL HIGH (ref 8–23)
CO2: 21 mmol/L — ABNORMAL LOW (ref 22–32)
Calcium: 7.6 mg/dL — ABNORMAL LOW (ref 8.9–10.3)
Chloride: 106 mmol/L (ref 98–111)
Creatinine, Ser: 3.31 mg/dL — ABNORMAL HIGH (ref 0.44–1.00)
GFR, Estimated: 14 mL/min — ABNORMAL LOW (ref >60)
Glucose, Bld: 243 mg/dL — ABNORMAL HIGH (ref 70–99)
Potassium: 3.4 mmol/L — ABNORMAL LOW (ref 3.5–5.1)
Sodium: 137 mmol/L (ref 135–145)
Total Bilirubin: 1.1 mg/dL (ref 0.3–1.2)
Total Protein: 5.6 g/dL — ABNORMAL LOW (ref 6.5–8.1)

## 2023-04-20 LAB — URINALYSIS, W/ REFLEX TO CULTURE (INFECTION SUSPECTED)
Bacteria, UA: NONE SEEN
Bilirubin Urine: NEGATIVE
Glucose, UA: NEGATIVE mg/dL
Hgb urine dipstick: NEGATIVE
Ketones, ur: NEGATIVE mg/dL
Leukocytes,Ua: NEGATIVE
Nitrite: NEGATIVE
Protein, ur: NEGATIVE mg/dL
Specific Gravity, Urine: 1.009 (ref 1.005–1.030)
pH: 5 (ref 5.0–8.0)

## 2023-04-20 LAB — VITAMIN D 25 HYDROXY (VIT D DEFICIENCY, FRACTURES): Vit D, 25-Hydroxy: 34.4 ng/mL (ref 30–100)

## 2023-04-20 SURGERY — OPEN REDUCTION INTERNAL FIXATION (ORIF) ANKLE FRACTURE
Anesthesia: Monitor Anesthesia Care | Site: Ankle | Laterality: Left

## 2023-04-20 MED ORDER — BUPIVACAINE HCL (PF) 0.5 % IJ SOLN
INTRAMUSCULAR | Status: AC
  Filled 2023-04-20: qty 20

## 2023-04-20 MED ORDER — PHENYLEPHRINE HCL-NACL 20-0.9 MG/250ML-% IV SOLN
INTRAVENOUS | Status: DC | PRN
  Administered 2023-04-20: 25 ug/min via INTRAVENOUS

## 2023-04-20 MED ORDER — PROPOFOL 10 MG/ML IV BOLUS
INTRAVENOUS | Status: DC | PRN
  Administered 2023-04-20 (×3): 20 mg via INTRAVENOUS

## 2023-04-20 MED ORDER — PROMETHAZINE HCL 25 MG/ML IJ SOLN: 6.2500 mg | INTRAMUSCULAR | Status: DC | PRN

## 2023-04-20 MED ORDER — FENTANYL CITRATE PF 50 MCG/ML IJ SOSY
PREFILLED_SYRINGE | INTRAMUSCULAR | Status: AC
  Filled 2023-04-20: qty 1

## 2023-04-20 MED ORDER — SODIUM CHLORIDE 0.9 % IV SOLN: INTRAVENOUS | Status: DC

## 2023-04-20 MED ORDER — OXYCODONE HCL 5 MG PO TABS: 5.0000 mg | ORAL_TABLET | Freq: Once | ORAL | Status: DC | PRN

## 2023-04-20 MED ORDER — CEFAZOLIN SODIUM-DEXTROSE 1-4 GM/50ML-% IV SOLN
INTRAVENOUS | Status: AC
  Filled 2023-04-20: qty 50

## 2023-04-20 MED ORDER — ISOSORBIDE MONONITRATE ER 30 MG PO TB24
60.0000 mg | ORAL_TABLET | Freq: Every day | ORAL | Status: DC
  Administered 2023-04-20 – 2023-04-24 (×5): 60 mg via ORAL
  Filled 2023-04-20 (×5): qty 2

## 2023-04-20 MED ORDER — FAMOTIDINE 20 MG PO TABS
20.0000 mg | ORAL_TABLET | Freq: Two times a day (BID) | ORAL | Status: DC
  Administered 2023-04-20 – 2023-04-21 (×3): 20 mg via ORAL
  Filled 2023-04-20 (×3): qty 1

## 2023-04-20 MED ORDER — MEDIHONEY WOUND/BURN DRESSING EX PSTE
1.0000 | PASTE | Freq: Every day | CUTANEOUS | Status: DC
  Administered 2023-04-20 – 2023-04-24 (×5): 1 via TOPICAL
  Filled 2023-04-20: qty 44

## 2023-04-20 MED ORDER — PROPOFOL 500 MG/50ML IV EMUL
INTRAVENOUS | Status: DC | PRN
  Administered 2023-04-20: 75 ug/kg/min via INTRAVENOUS

## 2023-04-20 MED ORDER — ONDANSETRON HCL 4 MG/2ML IJ SOLN
INTRAMUSCULAR | Status: DC | PRN
  Administered 2023-04-20 (×2): 4 mg via INTRAVENOUS

## 2023-04-20 MED ORDER — BUPIVACAINE LIPOSOME 1.3 % IJ SUSP
INTRAMUSCULAR | Status: DC | PRN
Start: 2023-04-20 — End: 2023-04-20
  Administered 2023-04-20 (×2): 10 mL via PERINEURAL

## 2023-04-20 MED ORDER — PROPOFOL 1000 MG/100ML IV EMUL
INTRAVENOUS | Status: AC
  Filled 2023-04-20: qty 100

## 2023-04-20 MED ORDER — EPHEDRINE SULFATE (PRESSORS) 50 MG/ML IJ SOLN
INTRAMUSCULAR | Status: DC | PRN
  Administered 2023-04-20 (×3): 5 mg via INTRAVENOUS

## 2023-04-20 MED ORDER — FENTANYL CITRATE PF 50 MCG/ML IJ SOSY
50.0000 ug | PREFILLED_SYRINGE | Freq: Once | INTRAMUSCULAR | Status: AC
  Administered 2023-04-20: 50 ug via INTRAVENOUS

## 2023-04-20 MED ORDER — VASOPRESSIN 20 UNIT/ML IV SOLN
INTRAVENOUS | Status: AC
  Filled 2023-04-20: qty 1

## 2023-04-20 MED ORDER — CARVEDILOL 12.5 MG PO TABS
12.5000 mg | ORAL_TABLET | Freq: Two times a day (BID) | ORAL | Status: DC
  Administered 2023-04-21 – 2023-04-24 (×7): 12.5 mg via ORAL
  Filled 2023-04-20 (×7): qty 1

## 2023-04-20 MED ORDER — VASOPRESSIN 20 UNIT/ML IV SOLN
INTRAVENOUS | Status: DC | PRN
  Administered 2023-04-20: 2 [IU] via INTRAVENOUS
  Administered 2023-04-20: 1 [IU] via INTRAVENOUS
  Administered 2023-04-20: 2 [IU] via INTRAVENOUS

## 2023-04-20 MED ORDER — KETAMINE HCL 10 MG/ML IJ SOLN
INTRAMUSCULAR | Status: DC | PRN
Start: 2023-04-20 — End: 2023-04-20
  Administered 2023-04-20: 20 mg via INTRAVENOUS
  Administered 2023-04-20: 10 mg via INTRAVENOUS
  Administered 2023-04-20: 20 mg via INTRAVENOUS

## 2023-04-20 MED ORDER — LIDOCAINE HCL (PF) 1 % IJ SOLN
INTRAMUSCULAR | Status: DC | PRN
  Administered 2023-04-20 (×2): 1 mL

## 2023-04-20 MED ORDER — INSULIN GLARGINE-YFGN 100 UNIT/ML ~~LOC~~ SOLN
20.0000 [IU] | Freq: Every day | SUBCUTANEOUS | Status: DC
  Administered 2023-04-20 – 2023-04-24 (×4): 20 [IU] via SUBCUTANEOUS
  Filled 2023-04-20 (×6): qty 0.2

## 2023-04-20 MED ORDER — KETAMINE HCL 50 MG/5ML IJ SOSY
PREFILLED_SYRINGE | INTRAMUSCULAR | Status: AC
  Filled 2023-04-20: qty 5

## 2023-04-20 MED ORDER — FENTANYL CITRATE (PF) 100 MCG/2ML IJ SOLN: 25.0000 ug | INTRAMUSCULAR | Status: DC | PRN

## 2023-04-20 MED ORDER — DROPERIDOL 2.5 MG/ML IJ SOLN: 0.6250 mg | Freq: Once | INTRAMUSCULAR | Status: DC | PRN

## 2023-04-20 MED ORDER — DEXAMETHASONE SODIUM PHOSPHATE 10 MG/ML IJ SOLN
INTRAMUSCULAR | Status: DC | PRN
  Administered 2023-04-20: 5 mg via INTRAVENOUS

## 2023-04-20 MED ORDER — LIDOCAINE HCL (PF) 1 % IJ SOLN
INTRAMUSCULAR | Status: AC
  Filled 2023-04-20: qty 5

## 2023-04-20 MED ORDER — BUPIVACAINE LIPOSOME 1.3 % IJ SUSP
INTRAMUSCULAR | Status: AC
  Filled 2023-04-20: qty 20

## 2023-04-20 MED ORDER — CEFAZOLIN SODIUM-DEXTROSE 1-4 GM/50ML-% IV SOLN
1.0000 g | Freq: Two times a day (BID) | INTRAVENOUS | Status: AC
  Administered 2023-04-21 (×2): 1 g via INTRAVENOUS
  Filled 2023-04-20 (×3): qty 50

## 2023-04-20 MED ORDER — BUPIVACAINE HCL (PF) 0.5 % IJ SOLN
INTRAMUSCULAR | Status: DC | PRN
  Administered 2023-04-20 (×2): 10 mL via PERINEURAL

## 2023-04-20 MED ORDER — OXYCODONE HCL 5 MG/5ML PO SOLN: 5.0000 mg | Freq: Once | ORAL | Status: DC | PRN

## 2023-04-20 MED ORDER — ACETAMINOPHEN 10 MG/ML IV SOLN: 1000.0000 mg | Freq: Once | INTRAVENOUS | Status: DC | PRN

## 2023-04-20 MED ORDER — CEFAZOLIN SODIUM-DEXTROSE 1-4 GM/50ML-% IV SOLN
1.0000 g | Freq: Once | INTRAVENOUS | Status: AC
  Administered 2023-04-20: 1 g via INTRAVENOUS

## 2023-04-20 MED ORDER — ACETAMINOPHEN 500 MG PO TABS
1000.0000 mg | ORAL_TABLET | Freq: Four times a day (QID) | ORAL | Status: DC | PRN
  Administered 2023-04-20 – 2023-04-23 (×2): 1000 mg via ORAL
  Filled 2023-04-20 (×2): qty 2

## 2023-04-20 MED ORDER — PHENYLEPHRINE 80 MCG/ML (10ML) SYRINGE FOR IV PUSH (FOR BLOOD PRESSURE SUPPORT)
PREFILLED_SYRINGE | INTRAVENOUS | Status: DC | PRN
  Administered 2023-04-20 (×3): 160 ug via INTRAVENOUS
  Administered 2023-04-20: 80 ug via INTRAVENOUS
  Administered 2023-04-20: 160 ug via INTRAVENOUS

## 2023-04-20 SURGICAL SUPPLY — 69 items
APL PRP STRL LF DISP 70% ISPRP (MISCELLANEOUS) ×1
BIT DRILL 2.0X130 SLD AO (BIT) IMPLANT
BIT DRILL CANNULTD 2.6 X 130MM (DRILL) IMPLANT
BNDG CMPR 5X4 CHSV STRCH STRL (GAUZE/BANDAGES/DRESSINGS) ×1
BNDG CMPR STD VLCR NS LF 5.8X4 (GAUZE/BANDAGES/DRESSINGS) ×1
BNDG COHESIVE 4X5 TAN STRL LF (GAUZE/BANDAGES/DRESSINGS) ×1 IMPLANT
BNDG ELASTIC 4X5.8 VLCR NS LF (GAUZE/BANDAGES/DRESSINGS) ×1 IMPLANT
BNDG ESMARCH 4 X 12 STRL LF (GAUZE/BANDAGES/DRESSINGS) ×1
BNDG ESMARCH 4X12 STRL LF (GAUZE/BANDAGES/DRESSINGS) ×1 IMPLANT
BNDG GAUZE DERMACEA FLUFF 4 (GAUZE/BANDAGES/DRESSINGS) ×1 IMPLANT
BNDG GZE DERMACEA 4 6PLY (GAUZE/BANDAGES/DRESSINGS) ×2
CHLORAPREP W/TINT 26 (MISCELLANEOUS) ×1 IMPLANT
CUFF TOURN SGL QUICK 18X4 (TOURNIQUET CUFF) IMPLANT
CUFF TOURN SGL QUICK 24 (TOURNIQUET CUFF) ×1
CUFF TRNQT CYL 24X4X16.5-23 (TOURNIQUET CUFF) IMPLANT
DRAPE C-ARM 42X70 (DRAPES) IMPLANT
DRAPE C-ARMOR (DRAPES) IMPLANT
DRAPE FLUOR MINI C-ARM 54X84 (DRAPES) IMPLANT
DRILL CANNULATED 2.6 X 130MM (DRILL) ×1
ELECT REM PT RETURN 9FT ADLT (ELECTROSURGICAL) ×1
ELECTRODE REM PT RTRN 9FT ADLT (ELECTROSURGICAL) ×1 IMPLANT
GAUZE SPONGE 4X4 12PLY STRL (GAUZE/BANDAGES/DRESSINGS) ×1 IMPLANT
GAUZE XEROFORM 1X8 LF (GAUZE/BANDAGES/DRESSINGS) ×1 IMPLANT
GLOVE BIOGEL PI IND STRL 8 (GLOVE) ×1 IMPLANT
GLOVE SS BIOGEL STRL SZ 7.5 (GLOVE) ×1 IMPLANT
GOWN STRL REUS W/ TWL LRG LVL3 (GOWN DISPOSABLE) ×1 IMPLANT
GOWN STRL REUS W/ TWL XL LVL3 (GOWN DISPOSABLE) ×1 IMPLANT
GOWN STRL REUS W/TWL LRG LVL3 (GOWN DISPOSABLE) ×1
GOWN STRL REUS W/TWL XL LVL3 (GOWN DISPOSABLE) ×1
HANDLE YANKAUER SUCT BULB TIP (MISCELLANEOUS) ×1 IMPLANT
K-WIRE SMOOTH 1.6X150MM (WIRE) ×2
K-WIRE SNGL END 1.2X150 (MISCELLANEOUS) ×1
KIT TURNOVER KIT A (KITS) ×1 IMPLANT
KWIRE SMOOTH 1.6X150MM (WIRE) IMPLANT
KWIRE SNGL END 1.2X150 (MISCELLANEOUS) IMPLANT
LABEL OR SOLS (LABEL) ×1 IMPLANT
MANIFOLD NEPTUNE II (INSTRUMENTS) ×1 IMPLANT
NDL HYPO 25X1 1.5 SAFETY (NEEDLE) ×2 IMPLANT
NEEDLE HYPO 25X1 1.5 SAFETY (NEEDLE) ×2 IMPLANT
NS IRRIG 500ML POUR BTL (IV SOLUTION) ×1 IMPLANT
PACK EXTREMITY ARMC (MISCELLANEOUS) ×1 IMPLANT
PAD ABD DERMACEA PRESS 5X9 (GAUZE/BANDAGES/DRESSINGS) ×1 IMPLANT
PAD PREP OB/GYN DISP 24X41 (PERSONAL CARE ITEMS) ×1 IMPLANT
PLATE FIB CL 9H LT (Plate) IMPLANT
PUTTY DBX 1CC (Putty) ×2 IMPLANT
PUTTY DBX 1CC DEPUY (Putty) IMPLANT
SCREW CANN HEAD LT 4.0X46 (Screw) IMPLANT
SCREW LOCK PLATE R3 2.7X11 (Screw) IMPLANT
SCREW LOCK PLATE R3 2.7X12 (Screw) IMPLANT
SCREW LOCK PLATE R3 2.7X13 (Screw) IMPLANT
SCREW LOCK PLATE R3 2.7X14 (Screw) IMPLANT
SOL PREP POV-IOD 4OZ 10% (MISCELLANEOUS) ×1 IMPLANT
SPLINT CAST 1 STEP 3X12 (MISCELLANEOUS) IMPLANT
SPLINT CAST 1 STEP 4X15 (MISCELLANEOUS) IMPLANT
SPLINT CAST 1 STEP 4X30 (MISCELLANEOUS) IMPLANT
SPONGE T-LAP 18X18 ~~LOC~~+RFID (SPONGE) ×1 IMPLANT
STAPLER SKIN PROX 35W (STAPLE) IMPLANT
STIMULATOR BONE GROWTH EMG EXT (ORTHOPEDIC SUPPLIES) IMPLANT
STOCKINETTE M/LG 89821 (MISCELLANEOUS) ×1 IMPLANT
STRAP SAFETY 5IN WIDE (MISCELLANEOUS) ×1 IMPLANT
SUT MNCRL AB 3-0 PS2 27 (SUTURE) IMPLANT
SUT MNCRL AB 4-0 PS2 18 (SUTURE) IMPLANT
SUT MON AB 5-0 P3 18 (SUTURE) IMPLANT
SUT VIC AB 4-0 PS2 27 (SUTURE) ×1 IMPLANT
SUT VICRYL AB 3-0 FS1 BRD 27IN (SUTURE) ×1 IMPLANT
SYR 10ML LL (SYRINGE) ×2 IMPLANT
TRAP FLUID SMOKE EVACUATOR (MISCELLANEOUS) ×1 IMPLANT
WATER STERILE IRR 500ML POUR (IV SOLUTION) ×1 IMPLANT
WIRE OLIVE SMOOTH 1.4MMX60MM (WIRE) IMPLANT

## 2023-04-20 NOTE — Op Note (Signed)
OPERATIVE REPORT Patient name: Tracy Webster MRN: [id number] DOB: 09-Apr-1946  DOS: 04/20/23  Preop Dx: Ankle fracture/dislocation left Postop Dx: same  Procedure:  1.  Open reduction internal fixation left ankle fracture 2.  Application of external bone stimulator  Surgeon: Felecia Shelling DPM  Anesthesia: Preoperative popliteal block in combination with monitored anesthesia care  Hemostasis: Thigh tourniquet inflated to a pressure of after esmarch exsanguination   EBL: 10 mL Materials: Paragon 28 fibular plate with screws.  Paragon 28 medial malleoli are 4.0 mm.  External bone stimulator Injectables: None Pathology: None  Condition: The patient tolerated the procedure and anesthesia well. No complications noted or reported   Justification for procedure: The patient is a 77 y.o. female with multiple comorbidities who presents today for surgical correction of traumatic ankle fracture dislocation left lower extremity.  The patient was told benefits as well as possible side effects of the surgery. The patient consented for surgical correction. The patient consent form was reviewed. All patient questions were answered. No guarantees were expressed or implied. The patient and the surgeon both signed the patient consent form with the witness present and placed in the patient's chart.   Procedure in Detail: The patient was brought to the operating room, placed in the operating table in the supine position at which time an aseptic scrub and drape were performed about the patient's respective lower extremity after anesthesia was induced as described above. Attention was then directed to the surgical area where procedure number one commenced.  Procedure #1: Open reduction internal fixation of left ankle fracture A 12 cm linear longitudinal skin incision was planned and made overlying the distal portion of the fibula using a #15 scalpel.  The incision was carried down to the level  of bone and fracture site with care taken to cut clamp ligate and retract away all small neurovascular structures traversing the incision site.  The dislocated fracture of the fibula was identified and soft tissue was carefully reflected away from the fracture site for better visualization.  There were multiple small displaced fracture fragments within the incision site at the fracture location which were removed.  This made reduction difficult in combination with the poor bone quality noted intraoperatively. Decision was made to temporarily reduce the dislocation of the tibiotalar joint and temporary fixated with a 0.62 K wire.  The tibiotalar joint was really fixated with a 0.62 K wire which allowed for better reduction of the fibular malleolus.  Temporary fixation of the fracture of the fibula as well as interfrag screw was not feasible given the poor bone quality and transverse type fracture.  The Paragon 28 fibular plate was placed along the fracture site and intraoperative x-ray fluoroscopy was utilized to visualize the placement of the plate along the fibula.  The proximal screws of the fibular plate were permanently fixated to the more stable part of the fibula proximal to the fracture site.  At this time the distal fibular malleolus was reduced and fixated as best as possible to the distal portion of the fibular plate.  All the screws were inserted in the standard AO fashion.  At this time 2 hours the past and the thigh tourniquet was deflated.  Excessive time due to the difficulty of the reduction of the fibula in combination with poor bone quality.  Attention was then directed to the medial malleolus where a 6 cm elliptical type incision was planned and made encompassing the compromised tissue of the open fracture site.  The ellipse skin wedge was removed in toto with healthy skin margins.  The incision was carried down to the level of fracture site and again there were multiple small displaced  fracture fragments noted within the incision site which were removed.  There was only one large fragment of the medial malleolus remaining which did not seem to perfectly contoured to the fracture site.  This large fracture fragment was reduced as best possible and permanent internal fixation was achieved using a 4.0 mm Paragon 28 screw which was inserted in the standard AO fashion and verified by intraoperative x-ray fluoroscopy.   DBX bone putty was utilized to feel any osseous voids within the fracture site of the medial malleolus.    Irrigation around the area was utilized and all incisions were irrigated in preparation for routine layered primary closure.  Routine layered primary closure was achieved using a combination of 3-0 Vicryl, 4-0 Vicryl, and skin staples for primary closure.  Dry sterile compressive dressings were then applied to all previously mentioned incision sites about the patient's lower extremity. The tourniquet which was used for hemostasis was deflated. All normal neurovascular responses including pink color and warmth returned all the digits of patient's lower extremity.  The patient was then transferred from the operating room to the recovery room having tolerated the procedure and anesthesia well. All vital signs are stable. After a brief stay in the recovery room the patient was readmitted to inpatient room with postoperative orders placed.    IMPRESSION: Despite the best efforts at reduction there is some residual increased space of the medial gutter but overall the tibiotalar joint is somewhat congruent and the fractures are stabilized and reduced as best possible given the poor bone quality intraoperatively and difficulty of the fracture.  Felecia Shelling, DPM [facility]  Dr. Felecia Shelling, DPM    [address] [place], [place] [address]                Office [phone]  Fax [phone]

## 2023-04-20 NOTE — Anesthesia Procedure Notes (Signed)
Anesthesia Regional Block: Popliteal block   Pre-Anesthetic Checklist: , timeout performed,  Correct Patient, Correct Site, Correct Laterality,  Correct Procedure, Correct Position, site marked,  Risks and benefits discussed,  Surgical consent,  Pre-op evaluation,  At surgeon's request and post-op pain management  Laterality: Left  Prep: chloraprep       Needles:  Injection technique: Single-shot  Needle Type: Stimiplex     Needle Length: 9cm  Needle Gauge: 22     Additional Needles:   Procedures:,,,, ultrasound used (permanent image in chart),,    Narrative:  Start time: 04/20/2023 2:24 PM End time: 04/20/2023 2:26 PM Injection made incrementally with aspirations every 5 mL.  Performed by: Personally  Anesthesiologist: Foye Deer, MD  Additional Notes: Patient consented for risk and benefits of nerve block including but not limited to nerve damage, failed block, bleeding and infection.  Patient voiced understanding.  Functioning IV was confirmed and monitors were applied.  Timeout done prior to procedure and prior to any sedation being given to the patient.  Patient confirmed procedure site prior to any sedation given to the patient. Sterile prep,hand hygiene and sterile gloves were used.  Minimal sedation used for procedure.  No paresthesia endorsed by patient during the procedure.  Negative aspiration and negative test dose prior to incremental administration of local anesthetic. The patient tolerated the procedure well with no immediate complications.

## 2023-04-20 NOTE — Progress Notes (Signed)
PROGRESS NOTE    BRIEA Webster  WUX:[id number] DOB: 1946/04/28 DOA: 04/19/2023 PCP: Jodi Marble, NP  Outpatient Specialists: nephrology    Brief Narrative:  Tracy Webster is a 77 y.o. female with medical history significant of diastolic heart failure, GERD, hypertension, stage IV CKD, morbid obesity presenting with fall, encephalopathy, ankle fracture, acute on chronic kidney disease.  Limited history in the setting of cephalopathy.  Per report, patient noted to have been seen August 6 in the ER for noted fall and secondary ankle fracture.  Per report, patient found down at home after wellness check perform a sister.  Patient does not recollect what happened.  Patient does note having issues with her ankle recently.  Patient denies any chest pain, shortness of breath, nausea vomiting or weakness.  Patient is unclear of what medication she takes.    Assessment & Plan:   Principal Problem:   Fall Active Problems:   Acute renal failure superimposed on stage 4 chronic kidney disease (HCC)   Ankle fracture   Acute encephalopathy   (HFpEF) heart failure with preserved ejection fraction (HCC)   Insulin-requiring or dependent type II diabetes mellitus (HCC)   Type I or II open trimalleolar fracture of left ankle   Obesity   Hypertension   Type 2 diabetes mellitus with stage 3 chronic kidney disease (HCC)   Pressure injury of skin  # Type 1 open trimalleolar fracture left ankle Podiatry following. Dislocation reduced in ER. Plan for operative repair today with podiatry - will need PT/OT consults after, likely SNF placement  # AKI on CKD4 Baseline cr is about 2.4, here 3.3. Likely prerenal from reduced PO as has been at home with the above fracture for one week and mobility severely limited from it - continue gentle fluids  # Acute metabolic encephalopathy 2/2 aki, appears resolved. CT head nothing acute - monitor  # T2DM With hyperglycemia - f/u A1c - continue SSI -  start semglee 20 - hold home 70/30 8/30  # HFpEF Appears compensated - cont home coreg, imdur - hold home lasix 2/2 pre-renal AKI  # HTN Here normotensive - hold home lisinopril given aki   DVT prophylaxis: heparin Code Status: full Family Communication: no answer when sister called today  Level of care: Telemetry Medical Status is: Inpatient Remains inpatient appropriate because: need for inpatient procedure    Consultants:  podiatry  Procedures: pending  Antimicrobials:  Peri-operative    Subjective: Moderate left ankle complain, no other complaints  Objective: Vitals:   04/19/23 2130 04/19/23 2231 04/20/23 0108 04/20/23 0803  BP: (!) 141/52 (!) 148/52  (!) 130/44  Pulse: (!) 58 69  61  Resp: 18 20  18   Temp: 97.7 F (36.5 C) 98.9 F (37.2 C)  (!) 97.2 F (36.2 C)  TempSrc:      SpO2: 100% 97%  96%  Weight:   83.6 kg   Height:   5' (1.524 m)     Intake/Output Summary (Last 24 hours) at 04/20/2023 0906 Last data filed at 04/20/2023 2536 Gross per 24 hour  Intake 2658.11 ml  Output 300 ml  Net 2358.11 ml   Filed Weights   04/20/23 0108  Weight: 83.6 kg    Examination:  General exam: Appears calm and comfortable  Respiratory system: Clear to auscultation. Respiratory effort normal. Cardiovascular system: S1 & S2 heard, RRR. No JVD, murmurs, rubs, gallops or clicks. No pedal edema. Gastrointestinal system: Abdomen is obese, soft and nontender. No organomegaly or masses  felt. Normal bowel sounds heard. Central nervous system: Alert and oriented. No focal neurological deficits. Extremities: left ankle dressed Skin: No rashes, lesions or ulcers Psychiatry: Judgement and insight appear normal. Mood & affect appropriate.     Data Reviewed: I have personally reviewed following labs and imaging studies  CBC: Recent Labs  Lab 04/19/23 1500 04/20/23 0413  WBC 10.6* 8.7  NEUTROABS 8.5*  --   HGB 10.2* 9.9*  HCT 32.0* 31.4*  MCV 91.4 91.5  PLT  320 334   Basic Metabolic Panel: Recent Labs  Lab 04/19/23 1500 04/19/23 2328 04/20/23 0413  NA 141 138 137  K 3.7 3.4* 3.4*  CL 107 106 106  CO2 21* 21* 21*  GLUCOSE 80 151* 243*  BUN 83* 80* 81*  CREATININE 3.68* 3.31* 3.31*  CALCIUM 8.2* 7.9* 7.6*   GFR: Estimated Creatinine Clearance: 13.6 mL/min (A) (by C-G formula based on SCr of 3.31 mg/dL (H)). Liver Function Tests: Recent Labs  Lab 04/20/23 0413  AST 25  ALT 19  ALKPHOS 59  BILITOT 1.1  PROT 5.6*  ALBUMIN 2.3*   No results for input(s): "LIPASE", "AMYLASE" in the last 168 hours. No results for input(s): "AMMONIA" in the last 168 hours. Coagulation Profile: No results for input(s): "INR", "PROTIME" in the last 168 hours. Cardiac Enzymes: Recent Labs  Lab 04/19/23 1500 04/19/23 2328  CKTOTAL 745* 572*   BNP (last 3 results) No results for input(s): "PROBNP" in the last 8760 hours. HbA1C: No results for input(s): "HGBA1C" in the last 72 hours. CBG: Recent Labs  Lab 04/19/23 1940 04/19/23 2318 04/20/23 0323 04/20/23 0413 04/20/23 0809  GLUCAP 84 129* 224* 227* 212*   Lipid Profile: No results for input(s): "CHOL", "HDL", "LDLCALC", "TRIG", "CHOLHDL", "LDLDIRECT" in the last 72 hours. Thyroid Function Tests: No results for input(s): "TSH", "T4TOTAL", "FREET4", "T3FREE", "THYROIDAB" in the last 72 hours. Anemia Panel: No results for input(s): "VITAMINB12", "FOLATE", "FERRITIN", "TIBC", "IRON", "RETICCTPCT" in the last 72 hours. Urine analysis:    Component Value Date/Time   COLORURINE YELLOW (A) 04/20/2023 0225   APPEARANCEUR CLEAR (A) 04/20/2023 0225   LABSPEC 1.009 04/20/2023 0225   PHURINE 5.0 04/20/2023 0225   GLUCOSEU NEGATIVE 04/20/2023 0225   HGBUR NEGATIVE 04/20/2023 0225   BILIRUBINUR NEGATIVE 04/20/2023 0225   KETONESUR NEGATIVE 04/20/2023 0225   PROTEINUR NEGATIVE 04/20/2023 0225   NITRITE NEGATIVE 04/20/2023 0225   LEUKOCYTESUR NEGATIVE 04/20/2023 0225   Sepsis  Labs: @LABRCNTIP (procalcitonin:4,lacticidven:4)  )No results found for this or any previous visit (from the past 240 hour(s)).       Radiology Studies: CT ANKLE LEFT WO CONTRAST  Result Date: 04/19/2023 CLINICAL DATA:  Left ankle fracture EXAM: CT OF THE LEFT ANKLE WITHOUT CONTRAST TECHNIQUE: Multidetector CT imaging of the left ankle was performed according to the standard protocol. Multiplanar CT image reconstructions were also generated. RADIATION DOSE REDUCTION: This exam was performed according to the departmental dose-optimization program which includes automated exposure control, adjustment of the mA and/or kV according to patient size and/or use of iterative reconstruction technique. COMPARISON:  Same day pre and post reduction radiographs FINDINGS: Bones/Joint/Cartilage Acute, comminuted, open trimalleolar fracture-dislocation of the left ankle. Comminuted, obliquely oriented fracture through the distal fibular metaphysis with lateral displacement and valgus angulation. Small minimally displaced posterior malleolar fracture. Comminuted fracture through the base of the medial malleolus with approximately 1.5 cm of fracture distraction. Lateral dislocation of the talus relative to the distal tibia. There is impaction deformity of the lateral tibial plafond.  Mildly displaced tiny cortical avulsion fractures along the medial talus. Tiny avulsion fracture along the dorsal talar neck without displacement. Subtalar alignment is maintained. Alignment at the midfoot is maintained. There is some bony ankylosis across the naviculocuneiform articulation. Ligaments Suboptimally assessed by CT. Muscles and Tendons Peroneal tendons appear laterally subluxed. Posteromedial ankle tendons are at risk for tendon entrapment secondary to adjacent fracture. Soft tissues Soft tissue wound at the medial aspect of the ankle closely approximating the medial malleolar fracture site. Diffuse soft tissue swelling. Small  amount of air within the soft tissues of the ankle. Air within the tibiotalar joint space suggests a traumatic arthrotomy. IMPRESSION: 1. Acute, comminuted, open trimalleolar fracture-dislocation of the left ankle, as described. 2. Mildly displaced tiny cortical avulsion fractures along the medial talus. Tiny avulsion fracture along the dorsal talar neck without displacement. 3. Peroneal tendons appear laterally subluxed. Posteromedial ankle tendons are at risk for tendon entrapment secondary to adjacent fracture. 4. Air within the tibiotalar joint space suggests a traumatic arthrotomy. Electronically Signed   By: Duanne Guess D.O.   On: 04/19/2023 19:11   DG Ankle 2 Views Left  Result Date: 04/19/2023 CLINICAL DATA:  Fall status post reduction of the left ankle. EXAM: LEFT ANKLE - 2 VIEW COMPARISON:  Earlier radiograph dated 04/19/2023. FINDINGS: Decreased in the lateral ankle dislocation since the earlier radiograph. There is significant residual lateral ankle dislocation. Displaced and angulated comminuted fracture of the distal fibula as well as displaced fracture of the medial malleolus as seen previously. There has been interval placement of a cast. IMPRESSION: 1. Decreased lateral ankle dislocation with significant residual lateral ankle dislocation. 2. Bimalleolar fractures. Electronically Signed   By: Elgie Collard M.D.   On: 04/19/2023 18:40   CT Head Wo Contrast  Result Date: 04/19/2023 CLINICAL DATA:  Head trauma, minor (Age >= 65y) EXAM: CT HEAD WITHOUT CONTRAST TECHNIQUE: Contiguous axial images were obtained from the base of the skull through the vertex without intravenous contrast. RADIATION DOSE REDUCTION: This exam was performed according to the departmental dose-optimization program which includes automated exposure control, adjustment of the mA and/or kV according to patient size and/or use of iterative reconstruction technique. COMPARISON:  CT Head 09/11/17 FINDINGS: Brain: No  evidence of acute infarction, hemorrhage, hydrocephalus, extra-axial collection or mass lesion/mass effect. Partially empty sella. Vascular: No hyperdense vessel or unexpected calcification. Skull: Normal. Negative for fracture or focal lesion. Sinuses/Orbits: No middle ear or mastoid effusion. Paranasal sinuses are clear. Orbits are unremarkable. Other: None. IMPRESSION: No acute intracranial abnormality. Electronically Signed   By: Lorenza Cambridge M.D.   On: 04/19/2023 16:34   DG Ankle Complete Left  Result Date: 04/19/2023 CLINICAL DATA:  Ankle deformity, open fracture with dislocation EXAM: LEFT ANKLE COMPLETE - 3+ VIEW COMPARISON:  04/11/2023 FINDINGS: Stage 4 Weber B fracture dislocation of the left ankle with oblique lateral malleolar and transverse medial malleolar components. Dominant distal tibial shaft component is overlapped with the talus by 1.2 cm, and displaced medially by 3.1 cm. There is resulting apex medial angulation between the main shaft fragments of the radius and ulna, and the talus and the rest of the ankle. A well-defined posterior malleolar fracture is not observed although imaging planes are far from ideal. The angulation of the hindfoot makes it difficult to assess the subtalar joints. IMPRESSION: 1. Stage 4 Weber B fracture dislocation of the left ankle with oblique lateral malleolar and transverse medial malleolar components, with substantial dislocation, for lap, and apex medial angulation. Electronically  Signed   By: Gaylyn Rong M.D.   On: 04/19/2023 16:29        Scheduled Meds:  heparin  5,000 Units Subcutaneous Q8H   insulin aspart  0-9 Units Subcutaneous Q4H   Continuous Infusions:   ceFAZolin (ANCEF) IV 1 g (04/20/23 7253)   dextrose 5 % and 0.45% NaCl 125 mL/hr at 04/20/23 6644     LOS: 1 day     Silvano Bilis, MD [facility]   If 7PM-7AM, please contact night-coverage [email or web address] Password [facility] 04/20/2023, 9:06 AM

## 2023-04-20 NOTE — Consult Note (Signed)
PHARMACY NOTE:  ANTIMICROBIAL RENAL DOSAGE ADJUSTMENT  Current antimicrobial regimen includes a mismatch between antimicrobial dosage and estimated renal function.  As per policy approved by the Pharmacy & Therapeutics and Medical Executive Committees, the antimicrobial dosage will be adjusted accordingly.  Current antimicrobial dosage: Cefazolin 1g Q8H  Indication: Open ankle fracture  Renal Function:  Estimated Creatinine Clearance: 13.6 mL/min (A) (by C-G formula based on SCr of 3.31 mg/dL (H)). []      On intermittent HD, scheduled: []      On CRRT    Antimicrobial dosage has been changed to: Cefazolin 1g Q12H  Additional comments:   Thank you for allowing pharmacy to be a part of this patient's care.  Celene Squibb, PharmD Clinical Pharmacist 04/20/2023 12:43 PM

## 2023-04-20 NOTE — Brief Op Note (Signed)
04/19/2023 - 04/20/2023  6:07 PM  PATIENT:  Tracy Webster  77 y.o. female  PRE-OPERATIVE DIAGNOSIS:  Left ankle fracture  POST-OPERATIVE DIAGNOSIS:  Left ankle fracture  PROCEDURE:  Procedure(s): OPEN REDUCTION INTERNAL FIXATION (ORIF) ANKLE FRACTURE (Left)  SURGEON:  Surgeons and Role:    Felecia Shelling, DPM - Primary  PHYSICIAN ASSISTANT:   ASSISTANTS: none   ANESTHESIA:   regional and MAC  EBL:  10 mL   BLOOD ADMINISTERED:none  DRAINS: none   LOCAL MEDICATIONS USED:  NONE  SPECIMEN:  No Specimen  DISPOSITION OF SPECIMEN:  N/A  COUNTS:  YES  TOURNIQUET:   Total Tourniquet Time Documented: Thigh (Left) - 120 minutes Total: Thigh (Left) - 120 minutes   DICTATION: .Reubin Milan Dictation  PLAN OF CARE: Admit to inpatient   PATIENT DISPOSITION:  PACU - hemodynamically stable.   Delay start of Pharmacological VTE agent (>24hrs) due to surgical blood loss or risk of bleeding: not applicable  Felecia Shelling, DPM [facility]  Dr. Felecia Shelling, DPM    [address] [place], [place] [address]                Office [phone]  Fax [phone]

## 2023-04-20 NOTE — Consult Note (Addendum)
WOC Nurse Consult Note: Reason for Consult: Consult requested for buttocks/sacrum/posterior thighs.  Pt was found down for an unknown period of time prior to admission and has several pressure injuries.  Wound type: The following Unstageable pressure injuries are 10% red, 90% yellow, mod amt tan drainage Right buttock 3X2cm Right posterior thigh 5X3cm Coccyx 5X2cm Sacrum 1X1cm Left posterior thigh 3X3cm Pressure Injury POA: Yes Dressing procedure/placement/frequency: Topical treatment orders provided for bedside nurses to perform as follows to assist with removal of nonviable tissue: Apply Medihoney to bilat buttocks/posterior thighs/sacrum Q days, then cover with foam dressing.  Change foam dressing Q 3 days or PRN soiling. Please re-consult if further assistance is needed.  Thank-you,  Cammie Mcgee MSN, RN, CWOCN, [place], CNS [phone]

## 2023-04-20 NOTE — Interval H&P Note (Signed)
History and Physical Interval Note:  04/20/2023 2:14 PM  Tracy Webster  has presented today for surgery, with the diagnosis of Left ankle fracture.  The various methods of treatment have been discussed with the patient and family. After consideration of risks, benefits and other options for treatment, the patient has consented to  Procedure(s): OPEN REDUCTION INTERNAL FIXATION (ORIF) ANKLE FRACTURE (Left) as a surgical intervention.  The patient's history has been reviewed, patient examined, no change in status, stable for surgery.  I have reviewed the patient's chart and labs.  Questions were answered to the patient's satisfaction.     Felecia Shelling

## 2023-04-20 NOTE — Inpatient Diabetes Management (Signed)
Inpatient Diabetes Program Recommendations  AACE/ADA: New Consensus Statement on Inpatient Glycemic Control (2015)  Target Ranges:  Prepandial:   less than 140 mg/dL      Peak postprandial:   less than 180 mg/dL (1-2 hours)      Critically ill patients:  140 - 180 mg/dL   Lab Results  Component Value Date   GLUCAP 141 (H) 04/20/2023   HGBA1C 5.7 (H) 08/15/2017    Review of Glycemic Control  Latest Reference Range & Units 04/19/23 23:18 04/20/23 03:23 04/20/23 04:13 04/20/23 08:09 04/20/23 11:02  Glucose-Capillary 70 - 99 mg/dL 782 (H) 956 (H) 213 (H) 212 (H) 141 (H)   Diabetes history: DM 2 Outpatient Diabetes medications:  Novolog mix- 70/30 - 8 units in the AM and 70/30 - 30 units every evening Current orders for Inpatient glycemic control:  Novolog 0-9 units q 4 hours Semglee 20 units q HS  Inpatient Diabetes Program Recommendations:    Agree with current orders.    Thanks,  Beryl Meager, RN, BC-ADM Inpatient Diabetes Coordinator Pager [phone] (8a-5p)

## 2023-04-20 NOTE — Anesthesia Procedure Notes (Signed)
Anesthesia Regional Block: Adductor canal block   Pre-Anesthetic Checklist: , timeout performed,  Correct Patient, Correct Site, Correct Laterality,  Correct Procedure, Correct Position, site marked,  Risks and benefits discussed,  Surgical consent,  Pre-op evaluation,  At surgeon's request and post-op pain management  Laterality: Left  Prep: chloraprep       Needles:  Injection technique: Single-shot  Needle Type: Stimiplex     Needle Length: 9cm  Needle Gauge: 22     Additional Needles:   Procedures:,,,, ultrasound used (permanent image in chart),,    Narrative:  Start time: 04/20/2023 2:22 PM End time: 04/20/2023 2:24 PM Injection made incrementally with aspirations every 5 mL.  Performed by: Personally  Anesthesiologist: Foye Deer, MD  Additional Notes: Patient consented for risk and benefits of nerve block including but not limited to nerve damage, failed block, bleeding and infection.  Patient voiced understanding.  Functioning IV was confirmed and monitors were applied.  Timeout done prior to procedure and prior to any sedation being given to the patient.  Patient confirmed procedure site prior to any sedation given to the patient. Sterile prep,hand hygiene and sterile gloves were used.  Minimal sedation used for procedure.  No paresthesia endorsed by patient during the procedure.  Negative aspiration and negative test dose prior to incremental administration of local anesthetic. The patient tolerated the procedure well with no immediate complications.

## 2023-04-20 NOTE — Progress Notes (Signed)
PT Cancellation Note  Patient Details Name: Tracy Webster MRN: [id number] DOB: 01/07/1946   Cancelled Treatment:    Reason Eval/Treat Not Completed: Patient not medically ready. Patient having ORIF today. Will plan for PT evaluation tomorrow.   , 04/20/2023, 8:51 AM

## 2023-04-20 NOTE — Anesthesia Procedure Notes (Addendum)
Procedure Name: General with mask airway Date/Time: 04/20/2023 3:40 PM  Performed by: Mohammed Kindle, CRNAPre-anesthesia Checklist: Patient identified, Emergency Drugs available, Suction available and Patient being monitored Patient Re-evaluated:Patient Re-evaluated prior to induction Oxygen Delivery Method: Simple face mask Induction Type: IV induction Placement Confirmation: positive ETCO2, CO2 detector and breath sounds checked- equal and bilateral Dental Injury: Teeth and Oropharynx as per pre-operative assessment

## 2023-04-20 NOTE — Transfer of Care (Signed)
Immediate Anesthesia Transfer of Care Note  Patient: Tracy Webster  Procedure(s) Performed: OPEN REDUCTION INTERNAL FIXATION (ORIF) ANKLE FRACTURE (Left: Ankle)  Patient Location: PACU  Anesthesia Type:General  Level of Consciousness: drowsy  Airway & Oxygen Therapy: Patient Spontanous Breathing  Post-op Assessment: Report given to RN and Post -op Vital signs reviewed and stable  Post vital signs: Reviewed and stable  Last Vitals:  Vitals Value Taken Time  BP 117/46   Temp    Pulse 62   Resp 30   SpO2 98     Last Pain:  Vitals:   04/20/23 1310  TempSrc: Temporal  PainSc: 0-No pain      Patients Stated Pain Goal: 0 (04/20/23 0830)  Complications: No notable events documented.

## 2023-04-20 NOTE — Anesthesia Preprocedure Evaluation (Addendum)
Anesthesia Evaluation  Patient identified by MRN, date of birth, ID band Patient awake    Reviewed: Allergy & Precautions, H&P , NPO status , Patient's Chart, lab work & pertinent test results, reviewed documented beta blocker date and time   Airway Mallampati: IV  TM Distance: >3 FB Neck ROM: full    Dental  (+)    Pulmonary neg pulmonary ROS   Pulmonary exam normal        Cardiovascular Exercise Tolerance: Poor hypertension, Pt. on home beta blockers and Pt. on medications +CHF (diastolic heart failure)  Normal cardiovascular exam     Neuro/Psych Acute metabolic encephalopathy on admission 2/2 aki, appears resolved. CT head nothing acute   negative psych ROS   GI/Hepatic Neg liver ROS,GERD  ,,  Endo/Other  diabetes, Type 2    Renal/GU Renal InsufficiencyRenal diseasestage IV CKD     Musculoskeletal   Abdominal  (+) + obese  Peds  Hematology negative hematology ROS (+)   Anesthesia Other Findings Past Medical History: No date: CKD (chronic kidney disease) No date: Diabetes mellitus without complication (HCC) No date: GERD (gastroesophageal reflux disease) No date: Hypertension No date: Uterine prolapse   BMI    Body Mass Index: 35.99 kg/m      Reproductive/Obstetrics negative OB ROS                              Anesthesia Physical Anesthesia Plan  ASA: 3  Anesthesia Plan: Regional and MAC   Post-op Pain Management: Regional block*   Induction: Intravenous  PONV Risk Score and Plan: Dexamethasone, Ondansetron and Treatment may vary due to age or medical condition  Airway Management Planned: Natural Airway  Additional Equipment:   Intra-op Plan:   Post-operative Plan:   Informed Consent: I have reviewed the patients History and Physical, chart, labs and discussed the procedure including the risks, benefits and alternatives for the proposed anesthesia with the  patient or authorized representative who has indicated his/her understanding and acceptance.     Dental Advisory Given  Plan Discussed with: Anesthesiologist, CRNA and Surgeon  Anesthesia Plan Comments:          Anesthesia Quick Evaluation

## 2023-04-20 NOTE — Plan of Care (Signed)

## 2023-04-21 ENCOUNTER — Encounter: Payer: Self-pay | Admitting: Podiatry

## 2023-04-21 LAB — BASIC METABOLIC PANEL
Anion gap: 10 (ref 5–15)
BUN: 73 mg/dL — ABNORMAL HIGH (ref 8–23)
CO2: 18 mmol/L — ABNORMAL LOW (ref 22–32)
Calcium: 7.6 mg/dL — ABNORMAL LOW (ref 8.9–10.3)
Chloride: 114 mmol/L — ABNORMAL HIGH (ref 98–111)
Creatinine, Ser: 2.92 mg/dL — ABNORMAL HIGH (ref 0.44–1.00)
GFR, Estimated: 16 mL/min — ABNORMAL LOW (ref >60)
Glucose, Bld: 138 mg/dL — ABNORMAL HIGH (ref 70–99)
Potassium: 4.3 mmol/L (ref 3.5–5.1)
Sodium: 142 mmol/L (ref 135–145)

## 2023-04-21 LAB — HEMOGLOBIN A1C
Hgb A1c MFr Bld: 5.2 % (ref 4.8–5.6)
Mean Plasma Glucose: 103 mg/dL

## 2023-04-21 LAB — CBC
HCT: 30.7 % — ABNORMAL LOW (ref 36.0–46.0)
Hemoglobin: 9.6 g/dL — ABNORMAL LOW (ref 12.0–15.0)
MCH: 29.2 pg (ref 26.0–34.0)
MCHC: 31.3 g/dL (ref 30.0–36.0)
MCV: 93.3 fL (ref 80.0–100.0)
Platelets: 369 10*3/uL (ref 150–400)
RBC: 3.29 MIL/uL — ABNORMAL LOW (ref 3.87–5.11)
RDW: 14.7 % (ref 11.5–15.5)
WBC: 8 10*3/uL (ref 4.0–10.5)
nRBC: 0 % (ref 0.0–0.2)

## 2023-04-21 LAB — GLUCOSE, CAPILLARY
Glucose-Capillary: 120 mg/dL — ABNORMAL HIGH (ref 70–99)
Glucose-Capillary: 128 mg/dL — ABNORMAL HIGH (ref 70–99)
Glucose-Capillary: 130 mg/dL — ABNORMAL HIGH (ref 70–99)
Glucose-Capillary: 144 mg/dL — ABNORMAL HIGH (ref 70–99)
Glucose-Capillary: 169 mg/dL — ABNORMAL HIGH (ref 70–99)
Glucose-Capillary: 215 mg/dL — ABNORMAL HIGH (ref 70–99)

## 2023-04-21 MED ORDER — FAMOTIDINE 20 MG PO TABS
20.0000 mg | ORAL_TABLET | Freq: Every day | ORAL | Status: DC
  Administered 2023-04-22 – 2023-04-24 (×3): 20 mg via ORAL
  Filled 2023-04-21 (×3): qty 1

## 2023-04-21 MED ORDER — SODIUM CHLORIDE 0.9 % IV SOLN: INTRAVENOUS | Status: DC | PRN

## 2023-04-21 NOTE — NC FL2 (Signed)
[place] MEDICAID FL2 LEVEL OF CARE FORM     IDENTIFICATION  Patient Name: Tracy Webster Birthdate: [birth year] Sex: female Admission Date (Current Location): 04/19/2023  [facility] and [place] Number:  Chiropodist and Address:  [facility], [address] [place], [place] [address]      Provider Number: [id number]  Attending Physician Name and Address:  Kathrynn Running, MD  Relative Name and Phone Number:  Kathreen Cosier (Sister)  [phone] [facility])    Current Level of Care: Hospital Recommended Level of Care: Skilled Nursing Facility Prior Approval Number:    Date Approved/Denied:   PASRR Number: [id number]  Discharge Plan:      Current Diagnoses: Patient Active Problem List   Diagnosis Date Noted   Pressure injury of skin 04/20/2023   Fall 04/19/2023   Ankle fracture 04/19/2023   (HFpEF) heart failure with preserved ejection fraction (HCC) 04/19/2023   Acute renal failure superimposed on stage 4 chronic kidney disease (HCC) 04/19/2023   Acute encephalopathy 04/19/2023   Type I or II open trimalleolar fracture of left ankle 04/19/2023   Hypoglycemia 09/11/2017   CKD (chronic kidney disease), stage III (HCC) 08/15/2017   Acute CHF (congestive heart failure) (HCC) 08/15/2017   Acute diastolic CHF (congestive heart failure) (HCC) 08/15/2017   Bronchospasm, acute    Insulin-requiring or dependent type II diabetes mellitus (HCC) 08/14/2017   Hypoglycemia due to insulin 08/14/2017   CHF, acute on chronic (HCC) 08/14/2017   CKD (chronic kidney disease), stage IV (HCC) 08/14/2017   Acute respiratory failure with hypoxia (HCC)    Obesity 05/19/2014   Hypertension 05/19/2014   Type 2 diabetes mellitus with stage 3 chronic kidney disease (HCC) 05/19/2014    Orientation RESPIRATION BLADDER Height & Weight     Self, Time, Situation, Place  O2 External catheter, Incontinent Weight: 184 lb 4.9 oz (83.6 kg) Height:  5' (152.4 cm)   BEHAVIORAL SYMPTOMS/MOOD NEUROLOGICAL BOWEL NUTRITION STATUS        Diet (carb modified)  AMBULATORY STATUS COMMUNICATION OF NEEDS Skin   Extensive Assist Verbally Surgical wounds, Other (Comment) (pressure injury L thigh, surgicial incision L foot, unstageable pressure injuries sacrum, coccyx, R thigh, R buttock)                       Personal Care Assistance Level of Assistance  Bathing, Feeding, Dressing Bathing Assistance: Maximum assistance Feeding assistance: Independent Dressing Assistance: Maximum assistance     Functional Limitations Info             SPECIAL CARE FACTORS FREQUENCY  PT (By licensed PT), OT (By licensed OT)     PT Frequency: 5 times per week OT Frequency: 5 times per week            Contractures      Additional Factors Info  Code Status, Allergies Code Status Info: full Allergies Info: Tetracyclines & Related           Current Medications (04/21/2023):  This is the current hospital active medication list Current Facility-Administered Medications  Medication Dose Route Frequency Provider Last Rate Last Admin   0.9 %  sodium chloride infusion   Intravenous Continuous Kathrynn Running, MD 75 mL/hr at 04/21/23 1430 Rate Change at 04/21/23 1430   0.9 %  sodium chloride infusion   Intravenous PRN Kathrynn Running, MD 5 mL/hr at 04/21/23 0651 New Bag at 04/21/23 0651   acetaminophen (TYLENOL) tablet 1,000 mg  1,000 mg  Oral Q6H PRN Manuela Schwartz, NP   1,000 mg at 04/20/23 0829   carvedilol (COREG) tablet 12.5 mg  12.5 mg Oral BID WC Kathrynn Running, MD   12.5 mg at 04/21/23 0807   ceFAZolin (ANCEF) IVPB 1 g/50 mL premix  1 g Intravenous Q12H CoulterEber Jones, RPH 100 mL/hr at 04/21/23 0651 1 g at 04/21/23 0651   [START ON 04/22/2023] famotidine (PEPCID) tablet 20 mg  20 mg Oral Daily Wouk, Wilfred Curtis, MD       heparin injection 5,000 Units  5,000 Units Subcutaneous Q8H Floydene Flock, MD   5,000 Units at 04/21/23 1349   insulin  aspart (novoLOG) injection 0-9 Units  0-9 Units Subcutaneous Q4H Floydene Flock, MD   1 Units at 04/21/23 1224   insulin glargine-yfgn (SEMGLEE) injection 20 Units  20 Units Subcutaneous QHS Kathrynn Running, MD   20 Units at 04/20/23 2226   isosorbide mononitrate (IMDUR) 24 hr tablet 60 mg  60 mg Oral Daily Kathrynn Running, MD   60 mg at 04/21/23 1016   leptospermum manuka honey (MEDIHONEY) paste 1 Application  1 Application Topical Daily Kathrynn Running, MD   1 Application at 04/21/23 1017   ondansetron (ZOFRAN) tablet 4 mg  4 mg Oral Q6H PRN Floydene Flock, MD       Or   ondansetron [facility]) injection 4 mg  4 mg Intravenous Q6H PRN Floydene Flock, MD         Discharge Medications: Please see discharge summary for a list of discharge medications.  Relevant Imaging Results:  Relevant Lab Results:   Additional Information SS #: [id number]  Zadiel Leyh E Telma Pyeatt, LCSW

## 2023-04-21 NOTE — Evaluation (Addendum)
Occupational Therapy Evaluation Patient Details Name: Tracy Webster MRN: [id number] DOB: 1945-12-08 Today's Date: 04/21/2023   History of Present Illness Pt presents due to a fall resulting in L trimalleolar ankle fracture and now s/p ORIF of L ankle (04/20/23). PMH includes CKD, HTN, DM, GERD, HR, T2D, and morbid obesity.   Clinical Impression   Tracy Webster was seen for OT/PT evaluation this date. Prior to hospital admission, pt was MOD I using RW for household mobility. Pt lives alone, neighbors assist with groceries. Pt denies pain at rest, increased to 6/10 in wounds on bottom with sitting, RN notified. Pt currently requires MIN A exit bed and rolling bed level. MOD A x2 + RW sit<>stand at EOB, tolerates <2 min standing, maintains NWBing with cues but intermittently TTWB. Attmepted lateral scoot along EOB, requires MAX A x2, limited by wounds/pain. Left in bed with breakfast set up. Pt would benefit from skilled OT to address noted impairments and functional limitations (see below for any additional details). Upon hospital discharge, recommend OT follow up.     If plan is discharge home, recommend the following: Two people to help with walking and/or transfers;Two people to help with bathing/dressing/bathroom    Functional Status Assessment  Patient has had a recent decline in their functional status and demonstrates the ability to make significant improvements in function in a reasonable and predictable amount of time.  Equipment Recommendations  Other (comment) (defer)    Recommendations for Other Services       Precautions / Restrictions Precautions Precautions: Fall Required Braces or Orthoses: Splint/Cast Splint/Cast: L ankle Splint/Cast - Date Prophylactic Dressing Applied (if applicable): 04/20/23 Restrictions Weight Bearing Restrictions: Yes LLE Weight Bearing: Non weight bearing      Mobility Bed Mobility Overal bed mobility: Needs Assistance Bed Mobility: Rolling,  Supine to Sit, Sit to Supine Rolling: Min assist   Supine to sit: Min assist Sit to supine: Max assist, +2 for physical assistance        Transfers Overall transfer level: Needs assistance Equipment used: Rolling walker (2 wheels) Transfers: Sit to/from Stand, Bed to chair/wheelchair/BSC Sit to Stand: Mod assist, +2 physical assistance          Lateral/Scoot Transfers: Max assist, +2 physical assistance General transfer comment: intermittent NWBing and TTWB as pt fatigues in standing      Balance Overall balance assessment: Needs assistance Sitting-balance support: No upper extremity supported, Feet supported Sitting balance-Leahy Scale: Fair     Standing balance support: Bilateral upper extremity supported, Reliant on assistive device for balance Standing balance-Leahy Scale: Poor                             ADL either performed or assessed with clinical judgement   ADL Overall ADL's : Needs assistance/impaired                                       General ADL Comments: MAX A don B socks seated EOB. MIN A rolling bed level for simulated pericare      Pertinent Vitals/Pain Pain Assessment Pain Assessment: 0-10 Pain Score: 6  Pain Location: wounds on bottom Pain Descriptors / Indicators: Discomfort, Grimacing Pain Intervention(s): Limited activity within patient's tolerance, Repositioned, Patient requesting pain meds-RN notified     Extremity/Trunk Assessment Upper Extremity Assessment Upper Extremity Assessment: Generalized weakness   Lower Extremity Assessment  Lower Extremity Assessment: Generalized weakness LLE Sensation: decreased light touch;WNL (L toes diminished sensation, above knee sensation WNL)       Communication Communication Communication: No apparent difficulties Cueing Techniques: Verbal cues;Tactile cues   Cognition Arousal: Alert Behavior During Therapy: WFL for tasks assessed/performed Overall Cognitive  Status: Within Functional Limits for tasks assessed                                                  Home Living Family/patient expects to be discharged to:: Private residence Living Arrangements: Alone Available Help at Discharge: Neighbor;Available PRN/intermittently Type of Home: House Home Access: Stairs to enter [employer] of Steps: 4 Entrance Stairs-Rails: Left;Right Home Layout: One level               Home Equipment: Agricultural consultant (2 wheels)          Prior Functioning/Environment Prior Level of Function : Independent/Modified Independent             Mobility Comments: utilized RW for mobility ADLs Comments: neighbors bring groceries        OT Problem List: Decreased strength;Decreased range of motion;Decreased activity tolerance;Impaired balance (sitting and/or standing);Decreased safety awareness;Pain      OT Treatment/Interventions: Self-care/ADL training;Therapeutic exercise;Energy conservation;DME and/or AE instruction;Therapeutic activities;Balance training;Canalith reposition    OT Goals(Current goals can be found in the care plan section) Acute Rehab OT Goals Patient Stated Goal: to walk OT Goal Formulation: With patient Time For Goal Achievement: 05/05/23 Potential to Achieve Goals: Good ADL Goals Pt Will Perform Grooming: with mod assist;standing Pt Will Perform Lower Body Dressing: with min assist;sitting/lateral leans (no cues to maintain NWBing) Pt Will Transfer to Toilet: with mod assist;squat pivot transfer;bedside commode  OT Frequency: Min 1X/week    Co-evaluation PT/OT/SLP Co-Evaluation/Treatment: Yes Reason for Co-Treatment: For patient/therapist safety PT goals addressed during session: Mobility/safety with mobility;Proper use of DME OT goals addressed during session: ADL's and self-care      AM-PAC OT "6 Clicks" Daily Activity     Outcome Measure Help from another person eating meals?:  None Help from another person taking care of personal grooming?: A Little Help from another person toileting, which includes using toliet, bedpan, or urinal?: A Lot Help from another person bathing (including washing, rinsing, drying)?: A Lot Help from another person to put on and taking off regular upper body clothing?: A Little Help from another person to put on and taking off regular lower body clothing?: A Lot 6 Click Score: 16   End of Session Equipment Utilized During Treatment: Rolling walker (2 wheels);Gait belt Nurse Communication: Mobility status;Patient requests pain meds  Activity Tolerance: Patient tolerated treatment well Patient left: in bed;with call bell/phone within reach;with bed alarm set  OT Visit Diagnosis: Other abnormalities of gait and mobility (R26.89);Muscle weakness (generalized) (M62.81)                Time: 4098-1191 OT Time Calculation (min): 43 min Charges:  OT General Charges $OT Visit: 1 Visit OT Evaluation $OT Eval Moderate Complexity: 1 Mod OT Treatments $Self Care/Home Management : 8-22 mins  Kathie Dike, M.S. OTR/L  04/21/23, 12:45 PM  ascom [phone]

## 2023-04-21 NOTE — Anesthesia Postprocedure Evaluation (Signed)
Anesthesia Post Note  Patient: Tracy Webster  Procedure(s) Performed: OPEN REDUCTION INTERNAL FIXATION (ORIF) ANKLE FRACTURE (Left: Ankle)  Patient location during evaluation: PACU Anesthesia Type: Regional Level of consciousness: awake and alert Pain management: pain level controlled Vital Signs Assessment: post-procedure vital signs reviewed and stable Respiratory status: spontaneous breathing, nonlabored ventilation, respiratory function stable and patient connected to nasal cannula oxygen Cardiovascular status: blood pressure returned to baseline and stable Postop Assessment: no apparent nausea or vomiting Anesthetic complications: no   No notable events documented.   Last Vitals:  Vitals:   04/20/23 1942 04/20/23 2339  BP: (!) 128/46 (!) 145/60  Pulse: (!) 58 71  Resp: 20 (!) 24  Temp: 36.4 C (!) 36.4 C  SpO2: 99% 99%    Last Pain:  Vitals:   04/20/23 2339  TempSrc: Oral  PainSc:                  Corinda Gubler

## 2023-04-21 NOTE — Plan of Care (Signed)
  Problem: Metabolic: Goal: Ability to maintain appropriate glucose levels will improve Outcome: Not Progressing   Problem: Skin Integrity: Goal: Risk for impaired skin integrity will decrease Outcome: Not Progressing   Problem: Health Behavior/Discharge Planning: Goal: Ability to manage health-related needs will improve Outcome: Adequate for Discharge

## 2023-04-21 NOTE — Progress Notes (Signed)
   Subjective: 1 Day Post-Op Procedure(s) (LRB): OPEN REDUCTION INTERNAL FIXATION (ORIF) ANKLE FRACTURE (Left) DOS:   Objective: Vital signs in last 24 hours: Temp:  [96.8 F (36 C)-98.5 F (36.9 C)] 98.2 F (36.8 C) (08/16 0807) Pulse Rate:  [58-72] 68 (08/16 0807) Resp:  [16-32] 16 (08/16 0807) BP: (117-157)/(41-61) 157/61 (08/16 0807) SpO2:  [88 %-100 %] 100 % (08/16 0807)  Recent Labs    04/19/23 1500 04/20/23 0413 04/21/23 0556  HGB 10.2* 9.9* 9.6*   Recent Labs    04/20/23 0413 04/21/23 0556  WBC 8.7 8.0  RBC 3.43* 3.29*  HCT 31.4* 30.7*  PLT 334 369   Recent Labs    04/20/23 0413 04/21/23 0556  NA 137 142  K 3.4* 4.3  CL 106 114*  CO2 21* 18*  BUN 81* 73*  CREATININE 3.31* 2.92*  GLUCOSE 243* 138*  CALCIUM 7.6* 7.6*   No results for input(s): "LABPT", "INR" in the last 72 hours.  Physical Exam: Posterior splint intact.  Foot is stable.  Capillary refill to the toes WNL.  Pain controlled.  Assessment/Plan: 1 Day Post-Op Procedure(s) (LRB): OPEN REDUCTION INTERNAL FIXATION (ORIF) ANKLE FRACTURE (Left) DOS: 04/20/2023  -Posterior splint left intact today.  Leave clean dry and intact until outpatient follow-up in the office -Strict NWB LLE -Plan for discharge to SNF.  I do believe this will be in the patient's best interest since she lives alone and unable to care for herself nonweightbearing -Podiatry will sign off.  From a surgical standpoint okay for discharge -Follow-up in office 2 weeks postdischarge   Felecia Shelling 04/21/2023, 4:56 PM  Felecia Shelling, DPM [facility]  Dr. Felecia Shelling, DPM    [address] [place], [place] [address]                Office [phone]  Fax [phone]

## 2023-04-21 NOTE — Evaluation (Addendum)
Physical Therapy Evaluation Patient Details Name: Tracy Webster MRN: [id number] DOB: 1946/05/25 Today's Date: 04/21/2023  History of Present Illness  Pt presents due to a fall resulting in L trimalleolar ankle fracture and now s/p ORIF of L ankle (04/20/23). PMH includes CKD, HTN, DM, GERD, HR, T2D, and morbid obesity.  Clinical Impression   Pt presents laying in bed, no complaints of pain at rest. She currently lives home alone in a 1 story house with 3-4 stairs to enter and B/L rails. PTA she utilized a RW for all mobility and was modi for ADLs within home, noting her neighbors would bring her food/groceries.   PT gross sensation screening revealed only decreased sensation in L toes, otherwise WFL. Pt able to initiate log roll, requiring only minA during sidelying>sitting position for trunk control. Pt performed sit<>stand with RW, modAx2, and verbal/tactile cueing for maintaining NWB prec. Pt able to tolerate standing for ~58min with with intermittent NWB of LLE, pt able to correct with verbal cueing but reverted back to TTWB with fatigue. Pt required maxAx2 for lateral scooting and sit> supine due to fatigue. Pt limited by 6/10 pain in bottom/low back region due to wounds as well as decreased activity tolerance. Pt would benefit from continued skilled therapy to maximize functional abilities.       If plan is discharge home, recommend the following: Two people to help with walking and/or transfers;Two people to help with bathing/dressing/bathroom;Direct supervision/assist for medications management;Direct supervision/assist for financial management;Assist for transportation;Help with stairs or ramp for entrance;Assistance with cooking/housework   Can travel by private vehicle   No    Equipment Recommendations Other (comment) (TBD at next venue of care)  Recommendations for Other Services       Functional Status Assessment Patient has had a recent decline in their functional status and  demonstrates the ability to make significant improvements in function in a reasonable and predictable amount of time.     Precautions / Restrictions Precautions Precautions: Fall Required Braces or Orthoses: Splint/Cast Splint/Cast: L ankle Splint/Cast - Date Prophylactic Dressing Applied (if applicable): 04/20/23 Restrictions Weight Bearing Restrictions: Yes LLE Weight Bearing: Non weight bearing      Mobility  Bed Mobility Overal bed mobility: Needs Assistance Bed Mobility: Supine to Sit, Rolling, Sit to Supine Rolling: Min assist   Supine to sit: Min assist Sit to supine: Max assist, +2 for physical assistance   General bed mobility comments: minA for trunk control from sidelying>sitting during log roll. MinA for rolling at end of session due to fatigue    Transfers Overall transfer level: Needs assistance Equipment used: Rolling walker (2 wheels) Transfers: Sit to/from Stand Sit to Stand: Mod assist, +2 physical assistance          Lateral/Scoot Transfers: Max assist, +2 physical assistance General transfer comment: Pt able to maintain standing with intermittent NWB of LLE, pt able to correct with verbal cueing but reverted back to TTWB.    Ambulation/Gait               General Gait Details: Unable to perform at this time  Stairs            Wheelchair Mobility     Tilt Bed    Modified Rankin (Stroke Patients Only)       Balance Overall balance assessment: Needs assistance Sitting-balance support: No upper extremity supported, Feet supported Sitting balance-Leahy Scale: Fair       Standing balance-Leahy Scale: Poor Standing balance comment: Pt required RW  and modAx2 to maintain standing and NWB precautions                             Pertinent Vitals/Pain Pain Assessment Pain Assessment: 0-10 Pain Score: 6  Pain Location: Bottom/ Low Back during activity Pain Descriptors / Indicators: Discomfort, Grimacing, Moaning Pain  Intervention(s): Monitored during session, Limited activity within patient's tolerance    Home Living Family/patient expects to be discharged to:: Private residence Living Arrangements: Alone Available Help at Discharge: Neighbor;Available PRN/intermittently Type of Home: House Home Access: Stairs to enter Entrance Stairs-Rails: Lawyer of Steps: 4   Home Layout: One level Home Equipment: Agricultural consultant (2 wheels)      Prior Function Prior Level of Function : Independent/Modified Independent             Mobility Comments: utilized RW for mobility ADLs Comments: neighbors bring groceries     Extremity/Trunk Assessment   Upper Extremity Assessment Upper Extremity Assessment: Generalized weakness    Lower Extremity Assessment Lower Extremity Assessment: Generalized weakness LLE Sensation: decreased light touch;WNL (L toes diminished sensation, above knee sensation WNL)       Communication   Communication Communication: No apparent difficulties Cueing Techniques: Verbal cues;Tactile cues  Cognition Arousal: Alert Behavior During Therapy: WFL for tasks assessed/performed Overall Cognitive Status: Within Functional Limits for tasks assessed                                          General Comments General comments (skin integrity, edema, etc.): Wounds on LB/ posterior thigh region    Exercises     Assessment/Plan    PT Assessment Patient needs continued PT services  PT Problem List Decreased strength;Decreased range of motion;Decreased activity tolerance;Decreased balance;Decreased mobility;Decreased coordination;Decreased knowledge of use of DME;Decreased safety awareness;Decreased knowledge of precautions;Impaired sensation;Pain       PT Treatment Interventions DME instruction;Gait training;Stair training;Functional mobility training;Therapeutic activities;Therapeutic exercise;Balance training;Neuromuscular  re-education;Patient/family education    PT Goals (Current goals can be found in the Care Plan section)  Acute Rehab PT Goals Patient Stated Goal: return home PT Goal Formulation: With patient Time For Goal Achievement: 05/05/23 Potential to Achieve Goals: Good    Frequency Min 1X/week     Co-evaluation PT/OT/SLP Co-Evaluation/Treatment: Yes Reason for Co-Treatment: For patient/therapist safety PT goals addressed during session: Mobility/safety with mobility;Proper use of DME OT goals addressed during session: ADL's and self-care       AM-PAC PT "6 Clicks" Mobility  Outcome Measure Help needed turning from your back to your side while in a flat bed without using bedrails?: A Little Help needed moving from lying on your back to sitting on the side of a flat bed without using bedrails?: A Lot Help needed moving to and from a bed to a chair (including a wheelchair)?: Total Help needed standing up from a chair using your arms (e.g., wheelchair or bedside chair)?: A Lot Help needed to walk in hospital room?: Total Help needed climbing 3-5 steps with a railing? : Total 6 Click Score: 10    End of Session Equipment Utilized During Treatment: Gait belt Activity Tolerance: Patient limited by fatigue;Patient limited by pain Patient left: in bed;with call bell/phone within reach;with bed alarm set;with SCD's reapplied Nurse Communication: Patient requests pain meds PT Visit Diagnosis: Other abnormalities of gait and mobility (R26.89);History of falling (Z91.81);Muscle  weakness (generalized) (M62.81);Pain Pain - part of body: Leg (bottom/ low back)    Time: 4098-1191 PT Time Calculation (min) (ACUTE ONLY): 43 min   Charges:   PT Evaluation $PT Eval Low Complexity: 1 Low PT Treatments $Therapeutic Activity: 8-22 mins PT General Charges $$ ACUTE PT VISIT: 1 Visit         Adelyne Marchese, PT, SPT 11:23 AM,04/21/23

## 2023-04-21 NOTE — TOC Initial Note (Signed)
Transition of Care [facility]) - Initial/Assessment Note    Patient Details  Name: Tracy Webster MRN: [id number] Date of Birth: 1945/10/09  [facility] CM/SW Contact:    Liliana Cline, LCSW Phone Number: 04/21/2023, 2:31 PM  Clinical Narrative:                 CSW met with patient at bedside. Patient is from home alone. PT is recommending SNF. Patient is agreeable, she is not sure of her preferences. Would like to review bed offers when available. SNF work up started.   Expected Discharge Plan: Skilled Nursing Facility Barriers to Discharge: Continued Medical Work up   Patient Goals and CMS Choice Patient states their goals for this hospitalization and ongoing recovery are:: SNF CMS Medicare.gov Compare Post Acute Care list provided to:: Patient Choice offered to / list presented to : Patient      Expected Discharge Plan and Services       Living arrangements for the past 2 months: Single Family Home                                      Prior Living Arrangements/Services Living arrangements for the past 2 months: Single Family Home Lives with:: Self Patient language and need for interpreter reviewed:: Yes Do you feel safe going back to the place where you live?: Yes      Need for Family Participation in Patient Care: Yes (Comment) Care giver support system in place?: Yes (comment)   Criminal Activity/Legal Involvement Pertinent to Current Situation/Hospitalization: No - Comment as needed  Activities of Daily Living Home Assistive Devices/Equipment: Dan Humphreys (specify type) ADL Screening (condition at time of admission) Patient's cognitive ability adequate to safely complete daily activities?: Yes Is the patient deaf or have difficulty hearing?: No Does the patient have difficulty seeing, even when wearing glasses/contacts?: No Does the patient have difficulty concentrating, remembering, or making decisions?: Yes Patient able to express need for  assistance with ADLs?: Yes Does the patient have difficulty dressing or bathing?: Yes Independently performs ADLs?: Yes (appropriate for developmental age) Does the patient have difficulty walking or climbing stairs?: Yes Weakness of Legs: Both Weakness of Arms/Hands: None  Permission Sought/Granted Permission sought to share information with : Facility Industrial/product designer granted to share information with : Yes, Verbal Permission Granted     Permission granted to share info w AGENCY: SNFs        Emotional Assessment       Orientation: : Oriented to Self, Oriented to Situation, Oriented to Place, Oriented to  Time Alcohol / Substance Use: Not Applicable Psych Involvement: No (comment)  Admission diagnosis:  Fall [W19.XXXA] Type III open fracture of left ankle, initial encounter [S82.892C] Acute renal failure superimposed on chronic kidney disease, unspecified acute renal failure type, unspecified CKD stage (HCC) [N17.9, N18.9] Patient Active Problem List   Diagnosis Date Noted   Pressure injury of skin 04/20/2023   Fall 04/19/2023   Ankle fracture 04/19/2023   (HFpEF) heart failure with preserved ejection fraction (HCC) 04/19/2023   Acute renal failure superimposed on stage 4 chronic kidney disease (HCC) 04/19/2023   Acute encephalopathy 04/19/2023   Type I or II open trimalleolar fracture of left ankle 04/19/2023   Hypoglycemia 09/11/2017   CKD (chronic kidney disease), stage III (HCC) 08/15/2017   Acute CHF (congestive heart failure) (HCC) 08/15/2017   Acute diastolic CHF (congestive  heart failure) (HCC) 08/15/2017   Bronchospasm, acute    Insulin-requiring or dependent type II diabetes mellitus (HCC) 08/14/2017   Hypoglycemia due to insulin 08/14/2017   CHF, acute on chronic (HCC) 08/14/2017   CKD (chronic kidney disease), stage IV (HCC) 08/14/2017   Acute respiratory failure with hypoxia (HCC)    Obesity 05/19/2014   Hypertension 05/19/2014   Type 2  diabetes mellitus with stage 3 chronic kidney disease (HCC) 05/19/2014   PCP:  Jodi Marble, NP Pharmacy:   [facility] [address]), [place] - [address] [place]) [place] [address] Phone: [phone] Fax: [phone]     Social Determinants of Health (SDOH) Social History: SDOH Screenings   Food Insecurity: No Food Insecurity (04/19/2023)  Housing: Low Risk  (04/19/2023)  Transportation Needs: No Transportation Needs (04/19/2023)  Utilities: Not At Risk (04/19/2023)  Tobacco Use: Low Risk  (04/20/2023)   SDOH Interventions:     Readmission Risk Interventions     No data to display

## 2023-04-21 NOTE — Plan of Care (Signed)

## 2023-04-21 NOTE — Progress Notes (Signed)
PROGRESS NOTE    Tracy Webster  WUJ:[id number] DOB: Jan 18, 1946 DOA: 04/19/2023 PCP: Jodi Marble, NP  Outpatient Specialists: nephrology    Brief Narrative:  Tracy Webster is a 77 y.o. female with medical history significant of diastolic heart failure, GERD, hypertension, stage IV CKD, morbid obesity presenting with fall, encephalopathy, ankle fracture, acute on chronic kidney disease.  Limited history in the setting of cephalopathy.  Per report, patient noted to have been seen August 6 in the ER for noted fall and secondary ankle fracture.  Per report, patient found down at home after wellness check perform a sister.  Patient does not recollect what happened.  Patient does note having issues with her ankle recently.  Patient denies any chest pain, shortness of breath, nausea vomiting or weakness.  Patient is unclear of what medication she takes.    Assessment & Plan:   Principal Problem:   Ankle fracture Active Problems:   Acute renal failure superimposed on stage 4 chronic kidney disease (HCC)   Acute encephalopathy   (HFpEF) heart failure with preserved ejection fraction (HCC)   Fall   Insulin-requiring or dependent type II diabetes mellitus (HCC)   Type I or II open trimalleolar fracture of left ankle   Obesity   Hypertension   Type 2 diabetes mellitus with stage 3 chronic kidney disease (HCC)   Pressure injury of skin  # Type 1 open trimalleolar fracture left ankle Podiatry following. Dislocation reduced in ER. ORIF 8/15 with podiatry. - pain control, bowel regimen - TOC pursuing SNF  # AKI on CKD4 Baseline cr is about 2.4, here 3.3. Likely prerenal from reduced PO as has been at home with the above fracture for one week and mobility severely limited from it. Cr improved today to 2.92 - continue gentle fluids  # Acute metabolic encephalopathy 2/2 aki, appears resolved. CT head nothing acute - monitor  # Stage 2 decubitus ulcers (POA) Sacrum, buttocks, and  upper thighs - wound care  # T2DM Glucose appropriate. A1c 5.2 - continue SSI - cont semglee 20 - hold home 70/30 8/30  # HFpEF Appears compensated - cont home coreg, imdur - hold home lasix 2/2 pre-renal AKI  # HTN Here normotensive - hold home lisinopril given aki   DVT prophylaxis: heparin Code Status: full Family Communication: no answer when sister called today  Level of care: Telemetry Medical Status is: Inpatient Remains inpatient appropriate because: need for inpatient procedure    Consultants:  podiatry  Procedures: pending  Antimicrobials:  Peri-operative    Subjective: Moderate left ankle complain, no other complaints  Objective: Vitals:   04/20/23 1920 04/20/23 1942 04/20/23 2339 04/21/23 0807  BP:  (!) 128/46 (!) 145/60 (!) 157/61  Pulse: 64 (!) 58 71 68  Resp: (!) 24 20 (!) 24 16  Temp:  97.6 F (36.4 C) (!) 97.5 F (36.4 C) 98.2 F (36.8 C)  TempSrc:   Oral   SpO2: 100% 99% 99% 100%  Weight:      Height:        Intake/Output Summary (Last 24 hours) at 04/21/2023 1358 Last data filed at 04/21/2023 1100 Gross per 24 hour  Intake 2343.84 ml  Output 360 ml  Net 1983.84 ml   Filed Weights   04/20/23 0108 04/20/23 1310  Weight: 83.6 kg 83.6 kg    Examination:  General exam: Appears calm and comfortable  Respiratory system: Clear to auscultation. Respiratory effort normal. Cardiovascular system: S1 & S2 heard, RRR. No JVD, murmurs,  rubs, gallops or clicks. No pedal edema. Gastrointestinal system: Abdomen is obese, soft and nontender. No organomegaly or masses felt. Normal bowel sounds heard. Central nervous system: Alert and oriented. No focal neurological deficits. Extremities: left ankle dressed Skin: stage 2 decubitus ulcers sacrum, upper thing, buttocks Psychiatry: Judgement and insight appear normal. Mood & affect appropriate.     Data Reviewed: I have personally reviewed following labs and imaging studies  CBC: Recent  Labs  Lab 04/19/23 1500 04/20/23 0413 04/21/23 0556  WBC 10.6* 8.7 8.0  NEUTROABS 8.5*  --   --   HGB 10.2* 9.9* 9.6*  HCT 32.0* 31.4* 30.7*  MCV 91.4 91.5 93.3  PLT 320 334 369   Basic Metabolic Panel: Recent Labs  Lab 04/19/23 1500 04/19/23 2328 04/20/23 0413 04/21/23 0556  NA 141 138 137 142  K 3.7 3.4* 3.4* 4.3  CL 107 106 106 114*  CO2 21* 21* 21* 18*  GLUCOSE 80 151* 243* 138*  BUN 83* 80* 81* 73*  CREATININE 3.68* 3.31* 3.31* 2.92*  CALCIUM 8.2* 7.9* 7.6* 7.6*   GFR: Estimated Creatinine Clearance: 15.5 mL/min (A) (by C-G formula based on SCr of 2.92 mg/dL (H)). Liver Function Tests: Recent Labs  Lab 04/20/23 0413  AST 25  ALT 19  ALKPHOS 59  BILITOT 1.1  PROT 5.6*  ALBUMIN 2.3*   No results for input(s): "LIPASE", "AMYLASE" in the last 168 hours. No results for input(s): "AMMONIA" in the last 168 hours. Coagulation Profile: No results for input(s): "INR", "PROTIME" in the last 168 hours. Cardiac Enzymes: Recent Labs  Lab 04/19/23 1500 04/19/23 2328  CKTOTAL 745* 572*   BNP (last 3 results) No results for input(s): "PROBNP" in the last 8760 hours. HbA1C: Recent Labs    04/19/23 1500  HGBA1C 5.2   CBG: Recent Labs  Lab 04/20/23 2132 04/21/23 0054 04/21/23 0422 04/21/23 0746 04/21/23 1148  GLUCAP 129* 120* 130* 128* 144*   Lipid Profile: No results for input(s): "CHOL", "HDL", "LDLCALC", "TRIG", "CHOLHDL", "LDLDIRECT" in the last 72 hours. Thyroid Function Tests: No results for input(s): "TSH", "T4TOTAL", "FREET4", "T3FREE", "THYROIDAB" in the last 72 hours. Anemia Panel: No results for input(s): "VITAMINB12", "FOLATE", "FERRITIN", "TIBC", "IRON", "RETICCTPCT" in the last 72 hours. Urine analysis:    Component Value Date/Time   COLORURINE YELLOW (A) 04/20/2023 0225   APPEARANCEUR CLEAR (A) 04/20/2023 0225   LABSPEC 1.009 04/20/2023 0225   PHURINE 5.0 04/20/2023 0225   GLUCOSEU NEGATIVE 04/20/2023 0225   HGBUR NEGATIVE 04/20/2023  0225   BILIRUBINUR NEGATIVE 04/20/2023 0225   KETONESUR NEGATIVE 04/20/2023 0225   PROTEINUR NEGATIVE 04/20/2023 0225   NITRITE NEGATIVE 04/20/2023 0225   LEUKOCYTESUR NEGATIVE 04/20/2023 0225   Sepsis Labs: @LABRCNTIP (procalcitonin:4,lacticidven:4)  )No results found for this or any previous visit (from the past 240 hour(s)).       Radiology Studies: DG Ankle Complete Left  Result Date: 04/20/2023 CLINICAL DATA:  Postop check EXAM: LEFT ANKLE COMPLETE - 3+ VIEW COMPARISON:  CT left ankle dated 08/14 within 24 FINDINGS: Compression plate and screw fixation of a distal fibular shaft fracture, mildly comminuted, in near anatomic alignment. Cancellous screw transfixing the medial malleolus, nondisplaced. Medial and lateral skin staples. Mild soft tissue swelling. Overlying cast/splint obscures fine osseous detail. IMPRESSION: Status post ORIF of the ankle, as above. Electronically Signed   By: Charline Bills M.D.   On: 04/20/2023 22:46   DG MINI C-ARM IMAGE ONLY  Result Date: 04/20/2023 There is no interpretation for this exam.  This order is for images obtained during a surgical procedure.  Please See "Surgeries" Tab for more information regarding the procedure.   [place] OR NERVE BLOCK-IMAGE ONLY [facility])  Result Date: 04/20/2023 There is no interpretation for this exam.  This order is for images obtained during a surgical procedure.  Please See "Surgeries" Tab for more information regarding the procedure.   [place] OR NERVE BLOCK-IMAGE ONLY [facility])  Result Date: 04/20/2023 There is no interpretation for this exam.  This order is for images obtained during a surgical procedure.  Please See "Surgeries" Tab for more information regarding the procedure.   CT ANKLE LEFT WO CONTRAST  Result Date: 04/19/2023 CLINICAL DATA:  Left ankle fracture EXAM: CT OF THE LEFT ANKLE WITHOUT CONTRAST TECHNIQUE: Multidetector CT imaging of the left ankle was performed according to the standard protocol.  Multiplanar CT image reconstructions were also generated. RADIATION DOSE REDUCTION: This exam was performed according to the departmental dose-optimization program which includes automated exposure control, adjustment of the mA and/or kV according to patient size and/or use of iterative reconstruction technique. COMPARISON:  Same day pre and post reduction radiographs FINDINGS: Bones/Joint/Cartilage Acute, comminuted, open trimalleolar fracture-dislocation of the left ankle. Comminuted, obliquely oriented fracture through the distal fibular metaphysis with lateral displacement and valgus angulation. Small minimally displaced posterior malleolar fracture. Comminuted fracture through the base of the medial malleolus with approximately 1.5 cm of fracture distraction. Lateral dislocation of the talus relative to the distal tibia. There is impaction deformity of the lateral tibial plafond. Mildly displaced tiny cortical avulsion fractures along the medial talus. Tiny avulsion fracture along the dorsal talar neck without displacement. Subtalar alignment is maintained. Alignment at the midfoot is maintained. There is some bony ankylosis across the naviculocuneiform articulation. Ligaments Suboptimally assessed by CT. Muscles and Tendons Peroneal tendons appear laterally subluxed. Posteromedial ankle tendons are at risk for tendon entrapment secondary to adjacent fracture. Soft tissues Soft tissue wound at the medial aspect of the ankle closely approximating the medial malleolar fracture site. Diffuse soft tissue swelling. Small amount of air within the soft tissues of the ankle. Air within the tibiotalar joint space suggests a traumatic arthrotomy. IMPRESSION: 1. Acute, comminuted, open trimalleolar fracture-dislocation of the left ankle, as described. 2. Mildly displaced tiny cortical avulsion fractures along the medial talus. Tiny avulsion fracture along the dorsal talar neck without displacement. 3. Peroneal tendons  appear laterally subluxed. Posteromedial ankle tendons are at risk for tendon entrapment secondary to adjacent fracture. 4. Air within the tibiotalar joint space suggests a traumatic arthrotomy. Electronically Signed   By: Duanne Guess D.O.   On: 04/19/2023 19:11   DG Ankle 2 Views Left  Result Date: 04/19/2023 CLINICAL DATA:  Fall status post reduction of the left ankle. EXAM: LEFT ANKLE - 2 VIEW COMPARISON:  Earlier radiograph dated 04/19/2023. FINDINGS: Decreased in the lateral ankle dislocation since the earlier radiograph. There is significant residual lateral ankle dislocation. Displaced and angulated comminuted fracture of the distal fibula as well as displaced fracture of the medial malleolus as seen previously. There has been interval placement of a cast. IMPRESSION: 1. Decreased lateral ankle dislocation with significant residual lateral ankle dislocation. 2. Bimalleolar fractures. Electronically Signed   By: Elgie Collard M.D.   On: 04/19/2023 18:40   CT Head Wo Contrast  Result Date: 04/19/2023 CLINICAL DATA:  Head trauma, minor (Age >= 65y) EXAM: CT HEAD WITHOUT CONTRAST TECHNIQUE: Contiguous axial images were obtained from the base of the skull through the vertex without intravenous  contrast. RADIATION DOSE REDUCTION: This exam was performed according to the departmental dose-optimization program which includes automated exposure control, adjustment of the mA and/or kV according to patient size and/or use of iterative reconstruction technique. COMPARISON:  CT Head 09/11/17 FINDINGS: Brain: No evidence of acute infarction, hemorrhage, hydrocephalus, extra-axial collection or mass lesion/mass effect. Partially empty sella. Vascular: No hyperdense vessel or unexpected calcification. Skull: Normal. Negative for fracture or focal lesion. Sinuses/Orbits: No middle ear or mastoid effusion. Paranasal sinuses are clear. Orbits are unremarkable. Other: None. IMPRESSION: No acute intracranial  abnormality. Electronically Signed   By: Lorenza Cambridge M.D.   On: 04/19/2023 16:34   DG Ankle Complete Left  Result Date: 04/19/2023 CLINICAL DATA:  Ankle deformity, open fracture with dislocation EXAM: LEFT ANKLE COMPLETE - 3+ VIEW COMPARISON:  04/11/2023 FINDINGS: Stage 4 Weber B fracture dislocation of the left ankle with oblique lateral malleolar and transverse medial malleolar components. Dominant distal tibial shaft component is overlapped with the talus by 1.2 cm, and displaced medially by 3.1 cm. There is resulting apex medial angulation between the main shaft fragments of the radius and ulna, and the talus and the rest of the ankle. A well-defined posterior malleolar fracture is not observed although imaging planes are far from ideal. The angulation of the hindfoot makes it difficult to assess the subtalar joints. IMPRESSION: 1. Stage 4 Weber B fracture dislocation of the left ankle with oblique lateral malleolar and transverse medial malleolar components, with substantial dislocation, for lap, and apex medial angulation. Electronically Signed   By: Gaylyn Rong M.D.   On: 04/19/2023 16:29        Scheduled Meds:  carvedilol  12.5 mg Oral BID WC   [START ON 04/22/2023] famotidine  20 mg Oral Daily   heparin  5,000 Units Subcutaneous Q8H   insulin aspart  0-9 Units Subcutaneous Q4H   insulin glargine-yfgn  20 Units Subcutaneous QHS   isosorbide mononitrate  60 mg Oral Daily   leptospermum manuka honey  1 Application Topical Daily   Continuous Infusions:  sodium chloride 100 mL/hr at 04/21/23 0805   sodium chloride 5 mL/hr at 04/21/23 0651    ceFAZolin (ANCEF) IV 1 g (04/21/23 0651)     LOS: 2 days     Silvano Bilis, MD [facility]   If 7PM-7AM, please contact night-coverage [email or web address] Password [email or web address] 04/21/2023, 1:58 PM

## 2023-04-22 ENCOUNTER — Inpatient Hospital Stay: Payer: Medicare HMO

## 2023-04-22 LAB — BASIC METABOLIC PANEL
Anion gap: 8 (ref 5–15)
BUN: 74 mg/dL — ABNORMAL HIGH (ref 8–23)
CO2: 17 mmol/L — ABNORMAL LOW (ref 22–32)
Calcium: 7.7 mg/dL — ABNORMAL LOW (ref 8.9–10.3)
Chloride: 111 mmol/L (ref 98–111)
Creatinine, Ser: 3.01 mg/dL — ABNORMAL HIGH (ref 0.44–1.00)
GFR, Estimated: 15 mL/min — ABNORMAL LOW (ref >60)
Glucose, Bld: 124 mg/dL — ABNORMAL HIGH (ref 70–99)
Potassium: 3.8 mmol/L (ref 3.5–5.1)
Sodium: 136 mmol/L (ref 135–145)

## 2023-04-22 LAB — CBC
HCT: 28.9 % — ABNORMAL LOW (ref 36.0–46.0)
Hemoglobin: 9.3 g/dL — ABNORMAL LOW (ref 12.0–15.0)
MCH: 29.4 pg (ref 26.0–34.0)
MCHC: 32.2 g/dL (ref 30.0–36.0)
MCV: 91.5 fL (ref 80.0–100.0)
Platelets: 378 10*3/uL (ref 150–400)
RBC: 3.16 MIL/uL — ABNORMAL LOW (ref 3.87–5.11)
RDW: 14.7 % (ref 11.5–15.5)
WBC: 10.6 10*3/uL — ABNORMAL HIGH (ref 4.0–10.5)
nRBC: 0.4 % — ABNORMAL HIGH (ref 0.0–0.2)

## 2023-04-22 LAB — GLUCOSE, CAPILLARY
Glucose-Capillary: 112 mg/dL — ABNORMAL HIGH (ref 70–99)
Glucose-Capillary: 119 mg/dL — ABNORMAL HIGH (ref 70–99)
Glucose-Capillary: 128 mg/dL — ABNORMAL HIGH (ref 70–99)
Glucose-Capillary: 148 mg/dL — ABNORMAL HIGH (ref 70–99)
Glucose-Capillary: 73 mg/dL (ref 70–99)
Glucose-Capillary: 94 mg/dL (ref 70–99)
Glucose-Capillary: 96 mg/dL (ref 70–99)

## 2023-04-22 MED ORDER — POLYETHYLENE GLYCOL 3350 17 G PO PACK
34.0000 g | PACK | Freq: Every day | ORAL | Status: DC
  Administered 2023-04-22 – 2023-04-23 (×2): 34 g via ORAL
  Filled 2023-04-22 (×2): qty 2

## 2023-04-22 MED ORDER — SENNA 8.6 MG PO TABS
1.0000 | ORAL_TABLET | Freq: Every day | ORAL | Status: DC
  Administered 2023-04-22 – 2023-04-23 (×2): 8.6 mg via ORAL
  Filled 2023-04-22 (×2): qty 1

## 2023-04-22 MED ORDER — SODIUM CHLORIDE 0.9 % IV BOLUS
1000.0000 mL | Freq: Once | INTRAVENOUS | Status: AC
  Administered 2023-04-22: 1000 mL via INTRAVENOUS

## 2023-04-22 MED ORDER — AMLODIPINE BESYLATE 10 MG PO TABS
10.0000 mg | ORAL_TABLET | Freq: Every day | ORAL | Status: DC
  Administered 2023-04-22 – 2023-04-24 (×3): 10 mg via ORAL
  Filled 2023-04-22 (×3): qty 1

## 2023-04-22 NOTE — Plan of Care (Signed)

## 2023-04-22 NOTE — Progress Notes (Signed)
PROGRESS NOTE    Tracy Webster  UJW:[id number] DOB: 07/15/1946 DOA: 04/19/2023 PCP: Jodi Marble, NP  Outpatient Specialists: nephrology    Brief Narrative:  Tracy Webster is a 77 y.o. female with medical history significant of diastolic heart failure, GERD, hypertension, stage IV CKD, morbid obesity presenting with fall, encephalopathy, ankle fracture, acute on chronic kidney disease.  Limited history in the setting of cephalopathy.  Per report, patient noted to have been seen August 6 in the ER for noted fall and secondary ankle fracture.  Per report, patient found down at home after wellness check perform a sister.  Patient does not recollect what happened.  Patient does note having issues with her ankle recently.  Patient denies any chest pain, shortness of breath, nausea vomiting or weakness.  Patient is unclear of what medication she takes.    Assessment & Plan:   Principal Problem:   Ankle fracture Active Problems:   Acute renal failure superimposed on stage 4 chronic kidney disease (HCC)   Acute encephalopathy   (HFpEF) heart failure with preserved ejection fraction (HCC)   Fall   Insulin-requiring or dependent type II diabetes mellitus (HCC)   Type I or II open trimalleolar fracture of left ankle   Obesity   Hypertension   Type 2 diabetes mellitus with stage 3 chronic kidney disease (HCC)   Pressure injury of skin  # Type 1 open trimalleolar fracture left ankle Podiatry following. Dislocation reduced in ER. ORIF 8/15 with podiatry. - per podiatry, leave posterior splint intact, NWB LLE, f/u in office 2 weeks - pain control, bowel regimen - [facility] can take her Monday, pending insurance auth  # AKI on CKD4 # Metabolic acidosis Baseline cr is about 2.4, here 3.3. Likely prerenal from reduced PO as has been at home with the above fracture for one week and mobility severely limited from it. Cr staying around 3. Urinalysis unremarkable - continue gentle fluids  (ordered but not running, will query rn) - check renal u/s - consider sodium bicarb  # Acute metabolic encephalopathy 2/2 aki, appears resolved. CT head nothing acute - monitor  # Stage 2 decubitus ulcers (POA) Sacrum, buttocks, and upper thighs - wound care  # T2DM Glucose appropriate. A1c 5.2 - continue SSI - cont semglee 20 - hold home 70/30 8/30  # HFpEF Appears compensated - cont home coreg, imdur - hold home lasix 2/2 AKI  # HTN Here mildly hypertensive - start amlodipine - hold home lisinopril given aki   DVT prophylaxis: heparin Code Status: full Family Communication: sister updated telephonically 8/17  Level of care: Telemetry Medical Status is: Inpatient Remains inpatient appropriate because: pending snf placement    Consultants:  podiatry  Procedures: pending  Antimicrobials:  Peri-operative    Subjective: No complaints  Objective: Vitals:   04/21/23 1659 04/21/23 2334 04/22/23 0759 04/22/23 0839  BP: (!) 158/69 131/61 (!) 145/62 (!) 152/63  Pulse: 71 64 (!) 54 (!) 56  Resp: 18 20 15 16   Temp: (!) 97.5 F (36.4 C) 98.9 F (37.2 C) 98.1 F (36.7 C) 97.8 F (36.6 C)  TempSrc:      SpO2: 98% 97% 95% 96%  Weight:      Height:        Intake/Output Summary (Last 24 hours) at 04/22/2023 1155 Last data filed at 04/22/2023 1031 Gross per 24 hour  Intake 1500.15 ml  Output 800 ml  Net 700.15 ml   Filed Weights   04/20/23 0108 04/20/23 1310  Weight: 83.6 kg 83.6 kg    Examination:  General exam: Appears calm and comfortable  Respiratory system: Clear to auscultation. Respiratory effort normal. Cardiovascular system: S1 & S2 heard, RRR. No JVD, murmurs, rubs, gallops or clicks. No pedal edema. Gastrointestinal system: Abdomen is obese, soft and nontender. No organomegaly or masses felt. Normal bowel sounds heard. Central nervous system: Alert and oriented. No focal neurological deficits. Extremities: left ankle dressed Skin: stage  2 decubitus ulcers sacrum, upper thing, buttocks on exam 8/16 Psychiatry: Judgement and insight appear normal. Mood & affect appropriate.     Data Reviewed: I have personally reviewed following labs and imaging studies  CBC: Recent Labs  Lab 04/19/23 1500 04/20/23 0413 04/21/23 0556 04/22/23 0629  WBC 10.6* 8.7 8.0 10.6*  NEUTROABS 8.5*  --   --   --   HGB 10.2* 9.9* 9.6* 9.3*  HCT 32.0* 31.4* 30.7* 28.9*  MCV 91.4 91.5 93.3 91.5  PLT 320 334 369 378   Basic Metabolic Panel: Recent Labs  Lab 04/19/23 1500 04/19/23 2328 04/20/23 0413 04/21/23 0556 04/22/23 0629  NA 141 138 137 142 136  K 3.7 3.4* 3.4* 4.3 3.8  CL 107 106 106 114* 111  CO2 21* 21* 21* 18* 17*  GLUCOSE 80 151* 243* 138* 124*  BUN 83* 80* 81* 73* 74*  CREATININE 3.68* 3.31* 3.31* 2.92* 3.01*  CALCIUM 8.2* 7.9* 7.6* 7.6* 7.7*   GFR: Estimated Creatinine Clearance: 15 mL/min (A) (by C-G formula based on SCr of 3.01 mg/dL (H)). Liver Function Tests: Recent Labs  Lab 04/20/23 0413  AST 25  ALT 19  ALKPHOS 59  BILITOT 1.1  PROT 5.6*  ALBUMIN 2.3*   No results for input(s): "LIPASE", "AMYLASE" in the last 168 hours. No results for input(s): "AMMONIA" in the last 168 hours. Coagulation Profile: No results for input(s): "INR", "PROTIME" in the last 168 hours. Cardiac Enzymes: Recent Labs  Lab 04/19/23 1500 04/19/23 2328  CKTOTAL 745* 572*   BNP (last 3 results) No results for input(s): "PROBNP" in the last 8760 hours. HbA1C: Recent Labs    04/19/23 1500  HGBA1C 5.2   CBG: Recent Labs  Lab 04/21/23 2022 04/22/23 0020 04/22/23 0459 04/22/23 0731 04/22/23 1129  GLUCAP 215* 148* 128* 119* 73   Lipid Profile: No results for input(s): "CHOL", "HDL", "LDLCALC", "TRIG", "CHOLHDL", "LDLDIRECT" in the last 72 hours. Thyroid Function Tests: No results for input(s): "TSH", "T4TOTAL", "FREET4", "T3FREE", "THYROIDAB" in the last 72 hours. Anemia Panel: No results for input(s): "VITAMINB12",  "FOLATE", "FERRITIN", "TIBC", "IRON", "RETICCTPCT" in the last 72 hours. Urine analysis:    Component Value Date/Time   COLORURINE YELLOW (A) 04/20/2023 0225   APPEARANCEUR CLEAR (A) 04/20/2023 0225   LABSPEC 1.009 04/20/2023 0225   PHURINE 5.0 04/20/2023 0225   GLUCOSEU NEGATIVE 04/20/2023 0225   HGBUR NEGATIVE 04/20/2023 0225   BILIRUBINUR NEGATIVE 04/20/2023 0225   KETONESUR NEGATIVE 04/20/2023 0225   PROTEINUR NEGATIVE 04/20/2023 0225   NITRITE NEGATIVE 04/20/2023 0225   LEUKOCYTESUR NEGATIVE 04/20/2023 0225   Sepsis Labs: @LABRCNTIP (procalcitonin:4,lacticidven:4)  )No results found for this or any previous visit (from the past 240 hour(s)).       Radiology Studies: DG Ankle Complete Left  Result Date: 04/20/2023 CLINICAL DATA:  Postop check EXAM: LEFT ANKLE COMPLETE - 3+ VIEW COMPARISON:  CT left ankle dated 08/14 within 24 FINDINGS: Compression plate and screw fixation of a distal fibular shaft fracture, mildly comminuted, in near anatomic alignment. Cancellous screw transfixing the medial malleolus,  nondisplaced. Medial and lateral skin staples. Mild soft tissue swelling. Overlying cast/splint obscures fine osseous detail. IMPRESSION: Status post ORIF of the ankle, as above. Electronically Signed   By: Charline Bills M.D.   On: 04/20/2023 22:46   DG MINI C-ARM IMAGE ONLY  Result Date: 04/20/2023 There is no interpretation for this exam.  This order is for images obtained during a surgical procedure.  Please See "Surgeries" Tab for more information regarding the procedure.   [place] OR NERVE BLOCK-IMAGE ONLY [facility])  Result Date: 04/20/2023 There is no interpretation for this exam.  This order is for images obtained during a surgical procedure.  Please See "Surgeries" Tab for more information regarding the procedure.   [place] OR NERVE BLOCK-IMAGE ONLY [facility])  Result Date: 04/20/2023 There is no interpretation for this exam.  This order is for images obtained during a surgical  procedure.  Please See "Surgeries" Tab for more information regarding the procedure.        Scheduled Meds:  carvedilol  12.5 mg Oral BID WC   famotidine  20 mg Oral Daily   heparin  5,000 Units Subcutaneous Q8H   insulin aspart  0-9 Units Subcutaneous Q4H   insulin glargine-yfgn  20 Units Subcutaneous QHS   isosorbide mononitrate  60 mg Oral Daily   leptospermum manuka honey  1 Application Topical Daily   Continuous Infusions:  sodium chloride 75 mL/hr at 04/22/23 0609   sodium chloride Stopped (04/21/23 0853)     LOS: 3 days     Silvano Bilis, MD [facility]   If 7PM-7AM, please contact night-coverage [email or web address] Password [facility] 04/22/2023, 11:55 AM

## 2023-04-22 NOTE — TOC Progression Note (Addendum)
Transition of Care [facility]) - Progression Note    Patient Details  Name: Tracy Webster MRN: [id number] Date of Birth: 09-Oct-1945  Transition of Care [facility]) CM/SW Contact  Liliana Cline, LCSW Phone Number: 04/22/2023, 9:50 AM  Clinical Narrative:    Patient has a bed offer at [facility] in [place]. Patient says she wants to talk with her family and CSW to follow up later.   10:45- Met with patient at bedside. Provided Medicare Care Compare list of SNFs. Patient states she wants to go with [facility]. [facility]ified [facility] at [facility], [facility] if medically ready and pending insurance auth. Asked MD when to start auth.   12:03- Per MD, patient is medically ready for SNF. Started auth in Availity portal.    Expected Discharge Plan: Skilled Nursing Facility Barriers to Discharge: Continued Medical Work up  Expected Discharge Plan and Services       Living arrangements for the past 2 months: Single Family Home                                       Social Determinants of Health (SDOH) Interventions SDOH Screenings   Food Insecurity: No Food Insecurity (04/19/2023)  Housing: Low Risk  (04/19/2023)  Transportation Needs: No Transportation Needs (04/19/2023)  Utilities: Not At Risk (04/19/2023)  Tobacco Use: Low Risk  (04/20/2023)    Readmission Risk Interventions     No data to display

## 2023-04-22 NOTE — Plan of Care (Signed)
  Problem: Education: Goal: Ability to describe self-care measures that may prevent or decrease complications (Diabetes Survival Skills Education) will improve Outcome: Progressing   Problem: Coping: Goal: Ability to adjust to condition or change in health will improve Outcome: Progressing   Problem: Health Behavior/Discharge Planning: Goal: Ability to identify and utilize available resources and services will improve Outcome: Progressing   Problem: Metabolic: Goal: Ability to maintain appropriate glucose levels will improve Outcome: Progressing   Problem: Skin Integrity: Goal: Risk for impaired skin integrity will decrease Outcome: Progressing

## 2023-04-23 LAB — GLUCOSE, CAPILLARY
Glucose-Capillary: 73 mg/dL (ref 70–99)
Glucose-Capillary: 73 mg/dL (ref 70–99)
Glucose-Capillary: 101 mg/dL — ABNORMAL HIGH (ref 70–99)
Glucose-Capillary: 102 mg/dL — ABNORMAL HIGH (ref 70–99)
Glucose-Capillary: 71 mg/dL (ref 70–99)
Glucose-Capillary: 78 mg/dL (ref 70–99)
Glucose-Capillary: 81 mg/dL (ref 70–99)

## 2023-04-23 LAB — BASIC METABOLIC PANEL
Anion gap: 8 (ref 5–15)
BUN: 68 mg/dL — ABNORMAL HIGH (ref 8–23)
CO2: 20 mmol/L — ABNORMAL LOW (ref 22–32)
Calcium: 8 mg/dL — ABNORMAL LOW (ref 8.9–10.3)
Chloride: 113 mmol/L — ABNORMAL HIGH (ref 98–111)
Creatinine, Ser: 3.02 mg/dL — ABNORMAL HIGH (ref 0.44–1.00)
GFR, Estimated: 15 mL/min — ABNORMAL LOW (ref >60)
Glucose, Bld: 80 mg/dL (ref 70–99)
Potassium: 4.2 mmol/L (ref 3.5–5.1)
Sodium: 141 mmol/L (ref 135–145)

## 2023-04-23 MED ORDER — LACTULOSE 10 GM/15ML PO SOLN
20.0000 g | Freq: Once | ORAL | Status: AC
  Administered 2023-04-23: 20 g via ORAL
  Filled 2023-04-23: qty 30

## 2023-04-23 NOTE — TOC Progression Note (Signed)
Transition of Care [facility]) - Progression Note    Patient Details  Name: Tracy Webster MRN: [id number] Date of Birth: 15-Jun-1946  Transition of Care [facility]) CM/SW Contact  Liliana Cline, LCSW Phone Number: 04/23/2023, 9:02 AM  Clinical Narrative:    Tracy Webster is approved in Availity portal. Plan for DC to [facility] on 8/19. Their Pharmacy is closed on Sundays.    Expected Discharge Plan: Skilled Nursing Facility Barriers to Discharge: Continued Medical Work up  Expected Discharge Plan and Services       Living arrangements for the past 2 months: Single Family Home                                       Social Determinants of Health (SDOH) Interventions SDOH Screenings   Food Insecurity: No Food Insecurity (04/19/2023)  Housing: Low Risk  (04/19/2023)  Transportation Needs: No Transportation Needs (04/19/2023)  Utilities: Not At Risk (04/19/2023)  Tobacco Use: Low Risk  (04/20/2023)    Readmission Risk Interventions     No data to display

## 2023-04-23 NOTE — Plan of Care (Signed)

## 2023-04-23 NOTE — Plan of Care (Signed)
A+OX4. BS being checked q4hrs and has been stable requiring minimal to no sliding scale coverage. Has been eating well. No s/s infection. Denies pain.   Problem: Metabolic: Goal: Ability to maintain appropriate glucose levels will improve Outcome: Progressing   Problem: Nutritional: Goal: Maintenance of adequate nutrition will improve Outcome: Progressing   Problem: Education: Goal: Knowledge of General Education information will improve Description: Including pain rating scale, medication(s)/side effects and non-pharmacologic comfort measures Outcome: Progressing   Problem: Clinical Measurements: Goal: Will remain free from infection Outcome: Progressing   Problem: Pain Managment: Goal: General experience of comfort will improve Outcome: Progressing

## 2023-04-23 NOTE — Progress Notes (Addendum)
PROGRESS NOTE    GYSELLE RENSCHLER  OZD:[id number] DOB: Dec 12, 1945 DOA: 04/19/2023 PCP: Jodi Marble, NP  Outpatient Specialists: nephrology    Brief Narrative:  Tracy Webster is a 77 y.o. female with medical history significant of diastolic heart failure, GERD, hypertension, stage IV CKD, morbid obesity presenting with fall, encephalopathy, ankle fracture, acute on chronic kidney disease.  Limited history in the setting of cephalopathy.  Per report, patient noted to have been seen August 6 in the ER for noted fall and secondary ankle fracture.  Per report, patient found down at home after wellness check perform a sister.  Patient does not recollect what happened.  Patient does note having issues with her ankle recently.  Patient denies any chest pain, shortness of breath, nausea vomiting or weakness.  Patient is unclear of what medication she takes.    Assessment & Plan:   Principal Problem:   Ankle fracture Active Problems:   Acute renal failure superimposed on stage 4 chronic kidney disease (HCC)   Acute encephalopathy   (HFpEF) heart failure with preserved ejection fraction (HCC)   Fall   Insulin-requiring or dependent type II diabetes mellitus (HCC)   Type I or II open trimalleolar fracture of left ankle   Obesity   Hypertension   Type 2 diabetes mellitus with stage 3 chronic kidney disease (HCC)   Pressure injury of skin  # Type 1 open trimalleolar fracture left ankle Podiatry following. Dislocation reduced in ER. ORIF 8/15 with podiatry. - per podiatry, leave posterior splint intact, NWB LLE, f/u in office 2 weeks - pain control, bowel regimen (had bm today) - [facility] can take her Monday, pending insurance auth  # AKI on CKD4 # Metabolic acidosis Baseline cr is about 2.4, here 3.3 improving to ~3 with fluids but now stable there. Renal u/s without signs obstruction. Suspect this represents worsening of her known ckd. Followed by nephrology as outpt - outpt f/u  #  Acute metabolic encephalopathy Appears resolved. CT head nothing acute - monitor  # Stage 2 decubitus ulcers (POA) Sacrum, buttocks, and upper thighs - wound care  # T2DM Glucose appropriate. A1c 5.2 - continue SSI - cont semglee 20 - hold home 70/30 8/30  # HFpEF Appears compensated - cont home coreg, imdur - holding home lasix    # HTN Here mildly hypertensive - started amlodipine - hold home lisinopril given aki   DVT prophylaxis: heparin Code Status: full Family Communication: sister updated telephonically 8/17  Level of care: Telemetry Medical Status is: Inpatient Remains inpatient appropriate because: pending snf placement. Looks like will be able to d/c tomorrow    Consultants:  podiatry  Procedures: pending  Antimicrobials:  Peri-operative    Subjective: No complaints  Objective: Vitals:   04/22/23 0839 04/22/23 1548 04/22/23 2308 04/23/23 0728  BP: (!) 152/63 (!) 164/57 (!) 134/57 (!) 157/65  Pulse: (!) 56 64 (!) 58 64  Resp: 16 16 20 16   Temp: 97.8 F (36.6 C) 97.7 F (36.5 C) 98 F (36.7 C) 98.2 F (36.8 C)  TempSrc:      SpO2: 96% 95% 95% 97%  Weight:      Height:        Intake/Output Summary (Last 24 hours) at 04/23/2023 1348 Last data filed at 04/23/2023 1122 Gross per 24 hour  Intake 1025.05 ml  Output 2850 ml  Net -1824.95 ml   Filed Weights   04/20/23 0108 04/20/23 1310  Weight: 83.6 kg 83.6 kg  Examination:  General exam: Appears calm and comfortable  Respiratory system: Clear to auscultation. Respiratory effort normal. Cardiovascular system: S1 & S2 heard, RRR. No JVD, murmurs, rubs, gallops or clicks. No pedal edema. Gastrointestinal system: Abdomen is obese, soft and nontender. No organomegaly or masses felt. Normal bowel sounds heard. Central nervous system: Alert and oriented. No focal neurological deficits. Extremities: left ankle dressed Skin: stage 2 decubitus ulcers sacrum, upper thing, buttocks on exam  8/16 Psychiatry: Judgement and insight appear normal. Mood & affect appropriate.     Data Reviewed: I have personally reviewed following labs and imaging studies  CBC: Recent Labs  Lab 04/19/23 1500 04/20/23 0413 04/21/23 0556 04/22/23 0629  WBC 10.6* 8.7 8.0 10.6*  NEUTROABS 8.5*  --   --   --   HGB 10.2* 9.9* 9.6* 9.3*  HCT 32.0* 31.4* 30.7* 28.9*  MCV 91.4 91.5 93.3 91.5  PLT 320 334 369 378   Basic Metabolic Panel: Recent Labs  Lab 04/19/23 2328 04/20/23 0413 04/21/23 0556 04/22/23 0629 04/23/23 0420  NA 138 137 142 136 141  K 3.4* 3.4* 4.3 3.8 4.2  CL 106 106 114* 111 113*  CO2 21* 21* 18* 17* 20*  GLUCOSE 151* 243* 138* 124* 80  BUN 80* 81* 73* 74* 68*  CREATININE 3.31* 3.31* 2.92* 3.01* 3.02*  CALCIUM 7.9* 7.6* 7.6* 7.7* 8.0*   GFR: Estimated Creatinine Clearance: 14.9 mL/min (A) (by C-G formula based on SCr of 3.02 mg/dL (H)). Liver Function Tests: Recent Labs  Lab 04/20/23 0413  AST 25  ALT 19  ALKPHOS 59  BILITOT 1.1  PROT 5.6*  ALBUMIN 2.3*   No results for input(s): "LIPASE", "AMYLASE" in the last 168 hours. No results for input(s): "AMMONIA" in the last 168 hours. Coagulation Profile: No results for input(s): "INR", "PROTIME" in the last 168 hours. Cardiac Enzymes: Recent Labs  Lab 04/19/23 1500 04/19/23 2328  CKTOTAL 745* 572*   BNP (last 3 results) No results for input(s): "PROBNP" in the last 8760 hours. HbA1C: No results for input(s): "HGBA1C" in the last 72 hours.  CBG: Recent Labs  Lab 04/22/23 1558 04/22/23 2112 04/23/23 0407 04/23/23 0741 04/23/23 1121  GLUCAP 94 112* 73 73 101*   Lipid Profile: No results for input(s): "CHOL", "HDL", "LDLCALC", "TRIG", "CHOLHDL", "LDLDIRECT" in the last 72 hours. Thyroid Function Tests: No results for input(s): "TSH", "T4TOTAL", "FREET4", "T3FREE", "THYROIDAB" in the last 72 hours. Anemia Panel: No results for input(s): "VITAMINB12", "FOLATE", "FERRITIN", "TIBC", "IRON",  "RETICCTPCT" in the last 72 hours. Urine analysis:    Component Value Date/Time   COLORURINE YELLOW (A) 04/20/2023 0225   APPEARANCEUR CLEAR (A) 04/20/2023 0225   LABSPEC 1.009 04/20/2023 0225   PHURINE 5.0 04/20/2023 0225   GLUCOSEU NEGATIVE 04/20/2023 0225   HGBUR NEGATIVE 04/20/2023 0225   BILIRUBINUR NEGATIVE 04/20/2023 0225   KETONESUR NEGATIVE 04/20/2023 0225   PROTEINUR NEGATIVE 04/20/2023 0225   NITRITE NEGATIVE 04/20/2023 0225   LEUKOCYTESUR NEGATIVE 04/20/2023 0225   Sepsis Labs: @LABRCNTIP (procalcitonin:4,lacticidven:4)  )No results found for this or any previous visit (from the past 240 hour(s)).       Radiology Studies: US RENAL  Result Date: 04/22/2023 CLINICAL DATA:  Acute kidney injury EXAM: RENAL / URINARY TRACT ULTRASOUND COMPLETE COMPARISON:  CT renal stone 05/21/2011 FINDINGS: Right Kidney: Renal measurements: 9.5 x 4.8 x 7.0 cm = volume: 166 mL. Echogenicity is increased. Cysts are identified in the superior pole measuring up to 2.9 x 1.4 x 1.6 cm. There is no  hydronephrosis. Left Kidney: Renal measurements: 9.2 x 5.2 x 4.8 cm = volume: 119 mL. Echogenicity is increased. A cyst is identified measuring 2.0 x 1 point 7 x 1.6 cm in the mid kidney. No hydronephrosis. Bladder: Appears normal for degree of bladder distention. Other: None. IMPRESSION: 1. Increased echogenicity in the bilateral kidneys is nonspecific but can be seen in the setting of medical renal disease. 2. Bilateral renal cysts. Electronically Signed   By: Darliss Cheney M.D.   On: 04/22/2023 23:36        Scheduled Meds:  amLODipine  10 mg Oral Daily   carvedilol  12.5 mg Oral BID WC   famotidine  20 mg Oral Daily   heparin  5,000 Units Subcutaneous Q8H   insulin aspart  0-9 Units Subcutaneous Q4H   insulin glargine-yfgn  20 Units Subcutaneous QHS   isosorbide mononitrate  60 mg Oral Daily   leptospermum manuka honey  1 Application Topical Daily   polyethylene glycol  34 g Oral Daily   senna   1 tablet Oral Daily   Continuous Infusions:  sodium chloride Stopped (04/21/23 0853)     LOS: 4 days     Silvano Bilis, MD [facility]   If 7PM-7AM, please contact night-coverage [email or web address] Password [email or web address] 04/23/2023, 1:48 PM

## 2023-04-24 LAB — GLUCOSE, CAPILLARY
Glucose-Capillary: 113 mg/dL — ABNORMAL HIGH (ref 70–99)
Glucose-Capillary: 144 mg/dL — ABNORMAL HIGH (ref 70–99)
Glucose-Capillary: 56 mg/dL — ABNORMAL LOW (ref 70–99)
Glucose-Capillary: 62 mg/dL — ABNORMAL LOW (ref 70–99)
Glucose-Capillary: 95 mg/dL (ref 70–99)
Glucose-Capillary: 97 mg/dL (ref 70–99)

## 2023-04-24 LAB — BASIC METABOLIC PANEL
Anion gap: 5 (ref 5–15)
BUN: 69 mg/dL — ABNORMAL HIGH (ref 8–23)
CO2: 20 mmol/L — ABNORMAL LOW (ref 22–32)
Calcium: 7.8 mg/dL — ABNORMAL LOW (ref 8.9–10.3)
Chloride: 116 mmol/L — ABNORMAL HIGH (ref 98–111)
Creatinine, Ser: 2.88 mg/dL — ABNORMAL HIGH (ref 0.44–1.00)
GFR, Estimated: 16 mL/min — ABNORMAL LOW (ref >60)
Glucose, Bld: 90 mg/dL (ref 70–99)
Potassium: 4.9 mmol/L (ref 3.5–5.1)
Sodium: 141 mmol/L (ref 135–145)

## 2023-04-24 MED ORDER — LISINOPRIL 20 MG PO TABS: 40.0000 mg | ORAL_TABLET | Freq: Every day | ORAL | Status: DC

## 2023-04-24 MED ORDER — POLYETHYLENE GLYCOL 3350 17 G PO PACK: 34.0000 g | PACK | Freq: Every day | ORAL | Status: DC

## 2023-04-24 MED ORDER — INSULIN GLARGINE-YFGN 100 UNIT/ML ~~LOC~~ SOLN: 15.0000 [IU] | Freq: Every day | SUBCUTANEOUS | Status: DC

## 2023-04-24 NOTE — TOC Transition Note (Signed)
Transition of Care [facility]) - CM/SW Discharge Note   Patient Details  Name: Tracy Webster MRN: [id number] Date of Birth: [birth year]  Transition of Care [facility]) CM/SW Contact:  Margarito Liner, LCSW Phone Number: 04/24/2023, 11:16 AM   Clinical Narrative:   Patient has orders to discharge to [facility] today. RN will call report to [phone] (Room 809). EMS transport has been arranged and she is 3rd on the list. No further concerns. CSW signing off.  Final next level of care: Skilled Nursing Facility Barriers to Discharge: Barriers Resolved   Patient Goals and CMS Choice CMS Medicare.gov Compare Post Acute Care list provided to:: Patient Choice offered to / list presented to : Patient  Discharge Placement     Existing PASRR number confirmed : 04/21/23          Patient chooses bed at: [facility] [place] Patient to be transferred to facility by: EMS Name of family member notified: Left voicemail for sister, Bjorn Loser. Patient and family notified of of transfer: 04/24/23  Discharge Plan and Services Additional resources added to the After Visit Summary for                                       Social Determinants of Health (SDOH) Interventions SDOH Screenings   Food Insecurity: No Food Insecurity (04/19/2023)  Housing: Low Risk  (04/19/2023)  Transportation Needs: No Transportation Needs (04/19/2023)  Utilities: Not At Risk (04/19/2023)  Tobacco Use: Low Risk  (04/20/2023)     Readmission Risk Interventions     No data to display

## 2023-04-24 NOTE — Care Management Important Message (Signed)
Important Message  Patient Details  Name: Tracy Webster MRN: [id number] Date of Birth: [birth year]   Medicare Important Message Given:  Yes     Olegario Messier A Evelise Reine 04/24/2023, 11:47 AM

## 2023-04-24 NOTE — Plan of Care (Signed)
  Problem: Education: Goal: Ability to describe self-care measures that may prevent or decrease complications (Diabetes Survival Skills Education) will improve Outcome: Not Progressing   Problem: Fluid Volume: Goal: Ability to maintain a balanced intake and output will improve Outcome: Not Progressing   Problem: Tissue Perfusion: Goal: Adequacy of tissue perfusion will improve Outcome: Not Progressing   Problem: Activity: Goal: Risk for activity intolerance will decrease Outcome: Progressing

## 2023-04-24 NOTE — Plan of Care (Signed)

## 2023-04-24 NOTE — Discharge Summary (Signed)
Tracy Webster:[id number] DOB: 1946/03/26 DOA: 04/19/2023  PCP: Jodi Marble, NP  Admit date: 04/19/2023 Discharge date: 04/24/2023  Time spent: 35 minutes  Recommendations for Outpatient Follow-up:  Podiatry f/u 2 weeks (need to call and schedule) Check BMP in one week to assess kidney function Also needs f/u with PCP and nephrology upon discharge from skilled nursing     Discharge Diagnoses:  Principal Problem:   Ankle fracture Active Problems:   Acute renal failure superimposed on stage 4 chronic kidney disease (HCC)   Acute encephalopathy   (HFpEF) heart failure with preserved ejection fraction (HCC)   Fall   Insulin-requiring or dependent type II diabetes mellitus (HCC)   Type I or II open trimalleolar fracture of left ankle   Obesity   Hypertension   Type 2 diabetes mellitus with stage 3 chronic kidney disease (HCC)   Pressure injury of skin   Discharge Condition: stable  Diet recommendation: heart health  Filed Weights   04/20/23 0108 04/20/23 1310  Weight: 83.6 kg 83.6 kg    History of present illness:  From admission h and p Tracy Webster is a 77 y.o. female with medical history significant of diastolic heart failure, GERD, hypertension, stage IV CKD, morbid obesity presenting with fall, encephalopathy, ankle fracture, acute on chronic kidney disease.  Limited history in the setting of cephalopathy.  Per report, patient noted to have been seen August 6 in the ER for noted fall and secondary ankle fracture.  Per report, patient found down at home after wellness check perform a sister.  Patient does not recollect what happened.  Patient does note having issues with her ankle recently.  Patient denies any chest pain, shortness of breath, nausea vomiting or weakness.  Patient is unclear of what medication she takes. Presented to the ER afebrile, hemodynamically stable.   Hospital Course:   # Type 1 open trimalleolar fracture left ankle Podiatry following.  Dislocation reduced in ER. ORIF 8/15 with podiatry. - per podiatry, leave posterior splint intact, NWB LLE, f/u in office 2 weeks   # AKI on CKD4 # Metabolic acidosis Baseline cr is about 2.4, here 3.3 improving to 2.8 with fluids. Suspect some degree of kidney decline. Acid/base and electrolytes acceptable - close outpt f/u with nephrology   # Acute metabolic encephalopathy Appears resolved. CT head nothing acute   # Stage 2 decubitus ulcers (POA) Sacrum, buttocks, and upper thighs - wound care   # T2DM Glucose appropriate. A1c 5.2 - continue insulin   # HFpEF Appears compensated   # HTN Here mildly hypertensive  Procedures: See above   Consultations: podiatry  Discharge Exam: Vitals:   04/24/23 0102 04/24/23 0817  BP: 133/63 (!) 135/49  Pulse: 78 64  Resp: (!) 24 16  Temp: 98 F (36.7 C)   SpO2: 96% 100%    General exam: Appears calm and comfortable  Respiratory system: Clear to auscultation. Respiratory effort normal. Cardiovascular system: S1 & S2 heard, RRR. No JVD, murmurs, rubs, gallops or clicks. No pedal edema. Gastrointestinal system: Abdomen is obese, soft and nontender. No organomegaly or masses felt. Normal bowel sounds heard. Central nervous system: Alert and oriented. No focal neurological deficits. Extremities: left ankle dressed Skin: stage 2 decubitus ulcers sacrum, upper thing, buttocks on exam 8/16 Psychiatry: Judgement and insight appear normal. Mood & affect appropriate.   Discharge Instructions   Discharge Instructions     Diet - low sodium heart healthy   Complete by: As directed  Discharge wound care:   Complete by: As directed    Apply Medihoney to bilat buttocks/posterior thighs/sacrum Q days, then cover with foam dressing.  Change foam dressing Q 3 days or PRN soiling   Increase activity slowly   Complete by: As directed       Allergies as of 04/24/2023       Reactions   Tetracyclines & Related         Medication  List     STOP taking these medications    diltiazem 300 MG 24 hr capsule Commonly known as: CARDIZEM CD   insulin aspart protamine- aspart (70-30) 100 UNIT/ML injection Commonly known as: NOVOLOG MIX 70/30   naproxen 375 MG tablet Commonly known as: NAPROSYN       TAKE these medications    acetaminophen 500 MG tablet Commonly known as: TYLENOL Take 1,000 mg by mouth every 6 (six) hours as needed for mild pain, moderate pain or fever.   carvedilol 12.5 MG tablet Commonly known as: COREG Take 12.5 mg by mouth 2 (two) times daily with a meal.   fexofenadine 180 MG tablet Commonly known as: ALLEGRA Take 180 mg by mouth daily as needed for allergies.   furosemide 40 MG tablet Commonly known as: LASIX Take 40 mg by mouth daily.   insulin glargine-yfgn 100 UNIT/ML injection Commonly known as: SEMGLEE Inject 0.15 mLs (15 Units total) into the skin at bedtime.   isosorbide mononitrate 120 MG 24 hr tablet Commonly known as: IMDUR Take 60 mg by mouth daily.   lisinopril 20 MG tablet Commonly known as: ZESTRIL Take 2 tablets (40 mg total) by mouth daily. What changed: medication strength   lovastatin 20 MG tablet Commonly known as: MEVACOR Take 20 mg by mouth every evening.   polyethylene glycol 17 g packet Commonly known as: MIRALAX / GLYCOLAX Take 34 g by mouth daily. Start taking on: April 25, 2023               Discharge Care Instructions  (From admission, onward)           Start     Ordered   04/24/23 0000  Discharge wound care:       Comments: Apply Medihoney to bilat buttocks/posterior thighs/sacrum Q days, then cover with foam dressing.  Change foam dressing Q 3 days or PRN soiling   04/24/23 1033           Allergies  Allergen Reactions   Tetracyclines & Related     Contact information for follow-up providers     Kathrin Greathouse, MD Follow up.   Specialty: Nephrology Contact information: [address] [place]  [address] [phone]         Felecia Shelling, DPM Follow up.   Specialty: Podiatry Why: in 2 weeks Contact information: [address] [place] [address] [phone]         Jodi Marble, NP Follow up.   Specialty: Nurse Practitioner Contact information: [address] [place] [address] [phone]              Contact information for after-discharge care     Destination     [facility] Preferred SNF .   Service: Skilled Nursing Contact information: [address] [place] [address] [phone]                      The results of significant diagnostics from this  hospitalization (including imaging, microbiology, ancillary and laboratory) are listed below for reference.    Significant Diagnostic Studies: US RENAL  Result Date: 04/22/2023 CLINICAL DATA:  Acute kidney injury EXAM: RENAL / URINARY TRACT ULTRASOUND COMPLETE COMPARISON:  CT renal stone 05/21/2011 FINDINGS: Right Kidney: Renal measurements: 9.5 x 4.8 x 7.0 cm = volume: 166 mL. Echogenicity is increased. Cysts are identified in the superior pole measuring up to 2.9 x 1.4 x 1.6 cm. There is no hydronephrosis. Left Kidney: Renal measurements: 9.2 x 5.2 x 4.8 cm = volume: 119 mL. Echogenicity is increased. A cyst is identified measuring 2.0 x 1 point 7 x 1.6 cm in the mid kidney. No hydronephrosis. Bladder: Appears normal for degree of bladder distention. Other: None. IMPRESSION: 1. Increased echogenicity in the bilateral kidneys is nonspecific but can be seen in the setting of medical renal disease. 2. Bilateral renal cysts. Electronically Signed   By: Darliss Cheney M.D.   On: 04/22/2023 23:36   DG Ankle Complete Left  Result Date: 04/20/2023 CLINICAL DATA:  Postop check EXAM: LEFT ANKLE COMPLETE - 3+ VIEW COMPARISON:  CT left ankle dated 08/14 within 24 FINDINGS: Compression plate and screw fixation of a distal fibular shaft fracture,  mildly comminuted, in near anatomic alignment. Cancellous screw transfixing the medial malleolus, nondisplaced. Medial and lateral skin staples. Mild soft tissue swelling. Overlying cast/splint obscures fine osseous detail. IMPRESSION: Status post ORIF of the ankle, as above. Electronically Signed   By: Charline Bills M.D.   On: 04/20/2023 22:46   DG MINI C-ARM IMAGE ONLY  Result Date: 04/20/2023 There is no interpretation for this exam.  This order is for images obtained during a surgical procedure.  Please See "Surgeries" Tab for more information regarding the procedure.   [place] OR NERVE BLOCK-IMAGE ONLY [facility]  Result Date: 04/20/2023 There is no interpretation for this exam.  This order is for images obtained during a surgical procedure.  Please See "Surgeries" Tab for more information regarding the procedure.   [place] OR NERVE BLOCK-IMAGE ONLY [facility])  Result Date: 04/20/2023 There is no interpretation for this exam.  This order is for images obtained during a surgical procedure.  Please See "Surgeries" Tab for more information regarding the procedure.   CT ANKLE LEFT WO CONTRAST  Result Date: 04/19/2023 CLINICAL DATA:  Left ankle fracture EXAM: CT OF THE LEFT ANKLE WITHOUT CONTRAST TECHNIQUE: Multidetector CT imaging of the left ankle was performed according to the standard protocol. Multiplanar CT image reconstructions were also generated. RADIATION DOSE REDUCTION: This exam was performed according to the departmental dose-optimization program which includes automated exposure control, adjustment of the mA and/or kV according to patient size and/or use of iterative reconstruction technique. COMPARISON:  Same day pre and post reduction radiographs FINDINGS: Bones/Joint/Cartilage Acute, comminuted, open trimalleolar fracture-dislocation of the left ankle. Comminuted, obliquely oriented fracture through the distal fibular metaphysis with lateral displacement and valgus angulation. Small minimally  displaced posterior malleolar fracture. Comminuted fracture through the base of the medial malleolus with approximately 1.5 cm of fracture distraction. Lateral dislocation of the talus relative to the distal tibia. There is impaction deformity of the lateral tibial plafond. Mildly displaced tiny cortical avulsion fractures along the medial talus. Tiny avulsion fracture along the dorsal talar neck without displacement. Subtalar alignment is maintained. Alignment at the midfoot is maintained. There is some bony ankylosis across the naviculocuneiform articulation. Ligaments Suboptimally assessed by CT. Muscles and Tendons Peroneal tendons appear laterally subluxed. Posteromedial ankle tendons are at risk for  tendon entrapment secondary to adjacent fracture. Soft tissues Soft tissue wound at the medial aspect of the ankle closely approximating the medial malleolar fracture site. Diffuse soft tissue swelling. Small amount of air within the soft tissues of the ankle. Air within the tibiotalar joint space suggests a traumatic arthrotomy. IMPRESSION: 1. Acute, comminuted, open trimalleolar fracture-dislocation of the left ankle, as described. 2. Mildly displaced tiny cortical avulsion fractures along the medial talus. Tiny avulsion fracture along the dorsal talar neck without displacement. 3. Peroneal tendons appear laterally subluxed. Posteromedial ankle tendons are at risk for tendon entrapment secondary to adjacent fracture. 4. Air within the tibiotalar joint space suggests a traumatic arthrotomy. Electronically Signed   By: Duanne Guess D.O.   On: 04/19/2023 19:11   DG Ankle 2 Views Left  Result Date: 04/19/2023 CLINICAL DATA:  Fall status post reduction of the left ankle. EXAM: LEFT ANKLE - 2 VIEW COMPARISON:  Earlier radiograph dated 04/19/2023. FINDINGS: Decreased in the lateral ankle dislocation since the earlier radiograph. There is significant residual lateral ankle dislocation. Displaced and angulated  comminuted fracture of the distal fibula as well as displaced fracture of the medial malleolus as seen previously. There has been interval placement of a cast. IMPRESSION: 1. Decreased lateral ankle dislocation with significant residual lateral ankle dislocation. 2. Bimalleolar fractures. Electronically Signed   By: Elgie Collard M.D.   On: 04/19/2023 18:40   CT Head Wo Contrast  Result Date: 04/19/2023 CLINICAL DATA:  Head trauma, minor (Age >= 65y) EXAM: CT HEAD WITHOUT CONTRAST TECHNIQUE: Contiguous axial images were obtained from the base of the skull through the vertex without intravenous contrast. RADIATION DOSE REDUCTION: This exam was performed according to the departmental dose-optimization program which includes automated exposure control, adjustment of the mA and/or kV according to patient size and/or use of iterative reconstruction technique. COMPARISON:  CT Head 09/11/17 FINDINGS: Brain: No evidence of acute infarction, hemorrhage, hydrocephalus, extra-axial collection or mass lesion/mass effect. Partially empty sella. Vascular: No hyperdense vessel or unexpected calcification. Skull: Normal. Negative for fracture or focal lesion. Sinuses/Orbits: No middle ear or mastoid effusion. Paranasal sinuses are clear. Orbits are unremarkable. Other: None. IMPRESSION: No acute intracranial abnormality. Electronically Signed   By: Lorenza Cambridge M.D.   On: 04/19/2023 16:34   DG Ankle Complete Left  Result Date: 04/19/2023 CLINICAL DATA:  Ankle deformity, open fracture with dislocation EXAM: LEFT ANKLE COMPLETE - 3+ VIEW COMPARISON:  04/11/2023 FINDINGS: Stage 4 Weber B fracture dislocation of the left ankle with oblique lateral malleolar and transverse medial malleolar components. Dominant distal tibial shaft component is overlapped with the talus by 1.2 cm, and displaced medially by 3.1 cm. There is resulting apex medial angulation between the main shaft fragments of the radius and ulna, and the talus and  the rest of the ankle. A well-defined posterior malleolar fracture is not observed although imaging planes are far from ideal. The angulation of the hindfoot makes it difficult to assess the subtalar joints. IMPRESSION: 1. Stage 4 Weber B fracture dislocation of the left ankle with oblique lateral malleolar and transverse medial malleolar components, with substantial dislocation, for lap, and apex medial angulation. Electronically Signed   By: Gaylyn Rong M.D.   On: 04/19/2023 16:29   DG Ankle 2 Views Left  Result Date: 04/11/2023 CLINICAL DATA:  Trauma, fall EXAM: LEFT ANKLE - 2 VIEW COMPARISON:  None Available. FINDINGS: No displaced fracture or dislocation is seen. In the lateral view, there is possible break in the posterior cortical  margin in the distal shaft of fibula. This finding is difficult to localize on the AP view. There is marked soft tissue swelling around the ankle and along the dorsal aspect of the foot. Small plantar spur is seen in calcaneus. Calcification at the attachment of Achilles tendon suggests calcific tendinosis. IMPRESSION: There is cortical irregularity in the posterior margin of the distal shaft of fibula seen only in the lateral view. Findings suggest possible undisplaced fracture. Please correlate with physical examination findings. Electronically Signed   By: Ernie Avena M.D.   On: 04/11/2023 13:22    Microbiology: No results found for this or any previous visit (from the past 240 hour(s)).   Labs: Basic Metabolic Panel: Recent Labs  Lab 04/20/23 0413 04/21/23 0556 04/22/23 0629 04/23/23 0420 04/24/23 0511  NA 137 142 136 141 141  K 3.4* 4.3 3.8 4.2 4.9  CL 106 114* 111 113* 116*  CO2 21* 18* 17* 20* 20*  GLUCOSE 243* 138* 124* 80 90  BUN 81* 73* 74* 68* 69*  CREATININE 3.31* 2.92* 3.01* 3.02* 2.88*  CALCIUM 7.6* 7.6* 7.7* 8.0* 7.8*   Liver Function Tests: Recent Labs  Lab 04/20/23 0413  AST 25  ALT 19  ALKPHOS 59  BILITOT 1.1  PROT  5.6*  ALBUMIN 2.3*   No results for input(s): "LIPASE", "AMYLASE" in the last 168 hours. No results for input(s): "AMMONIA" in the last 168 hours. CBC: Recent Labs  Lab 04/19/23 1500 04/20/23 0413 04/21/23 0556 04/22/23 0629  WBC 10.6* 8.7 8.0 10.6*  NEUTROABS 8.5*  --   --   --   HGB 10.2* 9.9* 9.6* 9.3*  HCT 32.0* 31.4* 30.7* 28.9*  MCV 91.4 91.5 93.3 91.5  PLT 320 334 369 378   Cardiac Enzymes: Recent Labs  Lab 04/19/23 1500 04/19/23 2328  CKTOTAL 745* 572*   BNP: BNP (last 3 results) No results for input(s): "BNP" in the last 8760 hours.  ProBNP (last 3 results) No results for input(s): "PROBNP" in the last 8760 hours.  CBG: Recent Labs  Lab 04/24/23 0044 04/24/23 0407 04/24/23 0818 04/24/23 0845 04/24/23 0929  GLUCAP 144* 95 56* 62* 97       Signed:  Silvano Bilis MD.  [facility] 04/24/2023, 10:34 AM

## 2023-05-07 DEATH — deceased

## 2023-05-09 ENCOUNTER — Ambulatory Visit: Payer: Medicare HMO | Admitting: Podiatry
